# Patient Record
Sex: Female | Born: 1950
Health system: Southern US, Community
[De-identification: ages and names within clinical notes are randomized; demographics above are authoritative.]

## PROBLEM LIST (undated history)

## (undated) DIAGNOSIS — S12200A Unspecified displaced fracture of third cervical vertebra, initial encounter for closed fracture: Secondary | ICD-10-CM

## (undated) DIAGNOSIS — M1990 Unspecified osteoarthritis, unspecified site: Secondary | ICD-10-CM

## (undated) HISTORY — PX: DILATION AND CURETTAGE OF UTERUS: SHX78

## (undated) HISTORY — PX: OTHER SURGICAL HISTORY: SHX169

## (undated) HISTORY — PX: LIGATION / DIVISION SAPHENOUS VEIN: SUR828

---

## 2002-11-13 HISTORY — PX: BREAST BIOPSY: SHX20

## 2002-11-13 HISTORY — PX: BREAST SURGERY: SHX581

## 2006-04-03 ENCOUNTER — Ambulatory Visit: Payer: Self-pay

## 2009-06-28 ENCOUNTER — Other Ambulatory Visit: Payer: Self-pay | Admitting: Internal Medicine

## 2009-07-06 ENCOUNTER — Ambulatory Visit: Payer: Self-pay | Admitting: Internal Medicine

## 2009-10-15 ENCOUNTER — Ambulatory Visit: Payer: Self-pay | Admitting: Unknown Physician Specialty

## 2009-10-22 LAB — HM COLONOSCOPY: HM Colonoscopy: NORMAL

## 2009-11-13 HISTORY — PX: NOVASURE ABLATION: SHX5394

## 2010-03-18 ENCOUNTER — Ambulatory Visit: Payer: Self-pay | Admitting: Unknown Physician Specialty

## 2010-08-17 ENCOUNTER — Other Ambulatory Visit: Payer: Self-pay | Admitting: Internal Medicine

## 2010-09-06 ENCOUNTER — Ambulatory Visit: Payer: Self-pay | Admitting: Internal Medicine

## 2011-07-24 ENCOUNTER — Encounter: Payer: Self-pay | Admitting: Internal Medicine

## 2011-07-24 ENCOUNTER — Ambulatory Visit (INDEPENDENT_AMBULATORY_CARE_PROVIDER_SITE_OTHER): Payer: PRIVATE HEALTH INSURANCE | Admitting: Internal Medicine

## 2011-07-24 DIAGNOSIS — L639 Alopecia areata, unspecified: Secondary | ICD-10-CM

## 2011-07-24 DIAGNOSIS — Z0000 Encounter for general adult medical examination without abnormal findings: Secondary | ICD-10-CM

## 2011-07-24 NOTE — Progress Notes (Signed)
Subjective:    Patient ID: [name] [name], female    DOB: 01-13-1951, 60 y.o.   MRN: [id number]  HPI [name] [name] is a 60 year old female who presents for her annual exam. She denies any complaints today. She does note a recent history of alopecia areata. She reports that she initially developed a small circular area of hair loss on her mid upper scalp, and this gradually became larger. She was ultimately seen by dermatology and had Kenalog injection performed. She has followup again with dermatology later this month. Otherwise, she is doing well. She denies any change in appetite. She continues to run on almost a daily basis. She has not had any menses since her NovaSure procedure in 2011.   Outpatient Encounter Prescriptions as of 07/24/2011  Medication Sig Dispense Refill  . clobetasol (TEMOVATE) 0.05 % ointment Apply 1 application topically 2 (two) times daily.        Marland Kitchen estradiol (ESTRACE) 1 MG tablet Take 1 mg by mouth every other day.        Marland Kitchen PROGESTERONE MICRONIZED PO Take 2.5 mg by mouth every other day.          Review of Systems  Constitutional: Negative for fever, chills, appetite change, fatigue and unexpected weight change.  HENT: Negative for ear pain, congestion, sore throat, trouble swallowing, neck pain, voice change and sinus pressure.   Eyes: Negative for visual disturbance.  Respiratory: Negative for cough, shortness of breath, wheezing and stridor.   Cardiovascular: Negative for chest pain, palpitations and leg swelling.  Gastrointestinal: Negative for nausea, vomiting, abdominal pain, diarrhea, constipation, blood in stool, abdominal distention and anal bleeding.  Genitourinary: Negative for dysuria and flank pain.  Musculoskeletal: Negative for myalgias, arthralgias and gait problem.  Skin: Positive for rash (hair loss over scalp). Negative for color change.  Neurological: Negative for dizziness and headaches.  Hematological: Negative for adenopathy. Does not  bruise/bleed easily.  Psychiatric/Behavioral: Negative for suicidal ideas, sleep disturbance and dysphoric mood. The patient is not nervous/anxious.    BP 167/84  Pulse 54  Temp(Src) 97.9 F (36.6 C) (Oral)  Resp 16  Ht 5\' 4"  (1.626 m)  Wt 136 lb 8 oz (61.916 kg)  BMI 23.43 kg/m2  SpO2 100%     Objective:   Physical Exam  Constitutional: She is oriented to person, place, and time. She appears well-developed and well-nourished. No distress.  HENT:  Head: Normocephalic and atraumatic.  Right Ear: External ear normal.  Left Ear: External ear normal.  Nose: Nose normal.  Mouth/Throat: Oropharynx is clear and moist. No oropharyngeal exudate.  Eyes: Conjunctivae are normal. Pupils are equal, round, and reactive to light. Right eye exhibits no discharge. Left eye exhibits no discharge. No scleral icterus.  Neck: Normal range of motion. Neck supple. Carotid bruit is not present. No tracheal deviation present. No thyromegaly present.  Cardiovascular: Normal rate, regular rhythm, normal heart sounds and intact distal pulses.  Exam reveals no gallop and no friction rub.   No murmur heard. Pulmonary/Chest: Effort normal and breath sounds normal. No respiratory distress. She has no wheezes. She has no rales. She exhibits no tenderness. Right breast exhibits no inverted nipple, no mass, no nipple discharge, no skin change and no tenderness. Left breast exhibits no inverted nipple, no mass, no nipple discharge, no skin change and no tenderness. Breasts are symmetrical.  Musculoskeletal: Normal range of motion. She exhibits no edema and no tenderness.  Lymphadenopathy:    She has no cervical adenopathy.  Neurological:  She is alert and oriented to person, place, and time. No cranial nerve deficit. She exhibits normal muscle tone. Coordination normal.  Skin: Skin is warm and dry. No rash noted. She is not diaphoretic. No erythema. No pallor.     Psychiatric: She has a normal mood and affect. Her  behavior is normal. Judgment and thought content normal.          Assessment & Plan:  1. Annual Exam - patient presents for annual exam. Will check CBC, CMP, lipid profile, TSH with labs today. She had a recent Pap smear which was normal and we will defer Pap smear today. Her next Pap smear will be due in 2013. We will request immunization record from the hospital. Mammogram ordered today. Encouraged her to continue with healthy diet and regular exercise including running.  2. Alopecia Areata - patient with a recent history of alopecia areata. Will check TSH with labs today. She has no other symptoms to suggest thyroid dysfunction such as fatigue, constipation, temperature intolerance, or weight gain. We will also request records from her dermatologist.

## 2011-07-24 NOTE — Patient Instructions (Signed)
Labs today. Mammogram to be scheduled. Return to clinic in 1 year.

## 2011-07-25 LAB — CBC WITH DIFFERENTIAL/PLATELET
Basophils Absolute: 0 10*3/uL (ref 0.0–0.1)
Basophils Relative: 0.5 % (ref 0.0–3.0)
Eosinophils Absolute: 0.2 10*3/uL (ref 0.0–0.7)
HCT: 36.2 % (ref 36.0–46.0)
Monocytes Absolute: 0.2 10*3/uL (ref 0.1–1.0)
Neutro Abs: 2.7 10*3/uL (ref 1.4–7.7)
Neutrophils Relative %: 59.5 % (ref 43.0–77.0)
Platelets: 219 10*3/uL (ref 150.0–400.0)
WBC: 4.5 10*3/uL (ref 4.5–10.5)

## 2011-07-26 ENCOUNTER — Encounter: Payer: Self-pay | Admitting: Internal Medicine

## 2011-07-26 LAB — LIPID PANEL
HDL: 80.5 mg/dL (ref >39.00)
LDL Cholesterol: 90 mg/dL (ref 0–99)
Total CHOL/HDL Ratio: 2

## 2011-07-26 LAB — COMPREHENSIVE METABOLIC PANEL
ALT: 58 U/L — ABNORMAL HIGH (ref 0–35)
AST: 60 U/L — ABNORMAL HIGH (ref 0–37)
Alkaline Phosphatase: 71 U/L (ref 39–117)
BUN: 16 mg/dL (ref 6–23)
Calcium: 9.3 mg/dL (ref 8.4–10.5)
Creatinine, Ser: 0.9 mg/dL (ref 0.4–1.2)
Total Bilirubin: 0.5 mg/dL (ref 0.3–1.2)

## 2011-07-27 ENCOUNTER — Telehealth: Payer: Self-pay | Admitting: *Deleted

## 2011-07-27 NOTE — Telephone Encounter (Signed)
Her labs are okay except that liver function tests were slightly elevated. We can just repeat LFTs in 1 month.

## 2011-07-27 NOTE — Telephone Encounter (Signed)
I have not seen them

## 2011-07-27 NOTE — Telephone Encounter (Signed)
Patient is asking if you have been able to review her labs. When I looked it up, I couldn't tell if it was every routed to you. Have you seen them?

## 2011-07-27 NOTE — Telephone Encounter (Signed)
Left message asking patient to return my call.

## 2011-07-28 ENCOUNTER — Telehealth: Payer: Self-pay | Admitting: Internal Medicine

## 2011-07-28 NOTE — Telephone Encounter (Signed)
Patient called and stated since her LFTs are elevated she forgot to tell you that her son was diagnosed 2 years ago with Gill-Barson syndrome.  She stated she knows that your LFTs are elevated with this and it's heredity and do you think she should be tested.

## 2011-07-31 NOTE — Telephone Encounter (Signed)
No, then she doesn't need any other testing.  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

## 2011-07-31 NOTE — Telephone Encounter (Signed)
Notified patient and lab appt scheduled.

## 2011-08-01 ENCOUNTER — Telehealth: Payer: Self-pay | Admitting: Internal Medicine

## 2011-08-07 NOTE — Telephone Encounter (Signed)
Patient notified

## 2011-08-10 NOTE — Telephone Encounter (Signed)
Left message for patient to call back  

## 2011-08-28 ENCOUNTER — Other Ambulatory Visit (INDEPENDENT_AMBULATORY_CARE_PROVIDER_SITE_OTHER): Payer: PRIVATE HEALTH INSURANCE | Admitting: *Deleted

## 2011-08-28 DIAGNOSIS — L659 Nonscarring hair loss, unspecified: Secondary | ICD-10-CM

## 2011-08-29 LAB — HEPATIC FUNCTION PANEL
ALT: 30 U/L (ref 0–35)
AST: 32 U/L (ref 0–37)
Albumin: 4.2 g/dL (ref 3.5–5.2)

## 2011-09-26 ENCOUNTER — Ambulatory Visit: Payer: Self-pay | Admitting: Internal Medicine

## 2011-10-11 ENCOUNTER — Encounter: Payer: Self-pay | Admitting: Internal Medicine

## 2011-11-30 ENCOUNTER — Other Ambulatory Visit: Payer: Self-pay | Admitting: *Deleted

## 2011-11-30 MED ORDER — ESTRADIOL 1 MG PO TABS: 1.0000 mg | ORAL_TABLET | Freq: Every day | ORAL | Status: DC

## 2011-11-30 MED ORDER — MEDROXYPROGESTERONE ACETATE 2.5 MG PO TABS: 2.5000 mg | ORAL_TABLET | Freq: Every day | ORAL | Status: DC

## 2011-12-20 ENCOUNTER — Encounter: Payer: Self-pay | Admitting: Internal Medicine

## 2012-07-21 LAB — HM PAP SMEAR: HM PAP: NEGATIVE

## 2012-07-25 ENCOUNTER — Other Ambulatory Visit: Payer: PRIVATE HEALTH INSURANCE

## 2012-07-29 ENCOUNTER — Ambulatory Visit (INDEPENDENT_AMBULATORY_CARE_PROVIDER_SITE_OTHER): Payer: PRIVATE HEALTH INSURANCE | Admitting: Internal Medicine

## 2012-07-29 ENCOUNTER — Encounter: Payer: Self-pay | Admitting: Internal Medicine

## 2012-07-29 VITALS — BP 126/72 | HR 56 | Temp 98.5°F | Ht 64.0 in | Wt 138.2 lb

## 2012-07-29 DIAGNOSIS — N959 Unspecified menopausal and perimenopausal disorder: Secondary | ICD-10-CM

## 2012-07-29 DIAGNOSIS — Z1239 Encounter for other screening for malignant neoplasm of breast: Secondary | ICD-10-CM

## 2012-07-29 DIAGNOSIS — Z0000 Encounter for general adult medical examination without abnormal findings: Secondary | ICD-10-CM | POA: Insufficient documentation

## 2012-07-29 MED ORDER — MEDROXYPROGESTERONE ACETATE 2.5 MG PO TABS: 2.5000 mg | ORAL_TABLET | Freq: Every day | ORAL | Status: AC

## 2012-07-29 MED ORDER — ESTRADIOL 1 MG PO TABS: 1.0000 mg | ORAL_TABLET | Freq: Every day | ORAL | Status: DC

## 2012-07-29 NOTE — Assessment & Plan Note (Signed)
General medical exam is normal today. Pap is pending. Mammogram is ordered. Colonoscopy is up-to-date. Will check basic labs including CBC, CMP, lipid profile. Immunizations are up-to-date. Will obtain records on vaccinations from [facility] employee health.

## 2012-07-29 NOTE — Progress Notes (Signed)
Subjective:    Patient ID: [name] [name], female    DOB: 1951-05-12, 61 y.o.   MRN: [id number]  HPI 61 year old female presents for annual exam. She reports she is feeling well. She continues to be active, working full-time as a Engineer, civil (consulting) in the [employer]. She follows a healthy diet. She runs for exercise several days per week. She denies any new complaints or concerns today.  Outpatient Encounter Prescriptions as of 07/29/2012  Medication Sig Dispense Refill  . estradiol (ESTRACE) 1 MG tablet Take 1 tablet (1 mg total) by mouth daily.  90 tablet  1  . clobetasol (TEMOVATE) 0.05 % ointment Apply 1 application topically 2 (two) times daily.        . medroxyPROGESTERone (PROVERA) 2.5 MG tablet Take 1 tablet (2.5 mg total) by mouth daily.  90 tablet  1    BP 126/72  Pulse 56  Temp 98.5 F (36.9 C) (Oral)  Ht 5\' 4"  (1.626 m)  Wt 138 lb 4 oz (62.71 kg)  BMI 23.73 kg/m2  SpO2 97%  Review of Systems  Constitutional: Negative for fever, chills, appetite change, fatigue and unexpected weight change.  HENT: Negative for ear pain, congestion, sore throat, trouble swallowing, neck pain, voice change and sinus pressure.   Eyes: Negative for visual disturbance.  Respiratory: Negative for cough, shortness of breath, wheezing and stridor.   Cardiovascular: Negative for chest pain, palpitations and leg swelling.  Gastrointestinal: Negative for nausea, vomiting, abdominal pain, diarrhea, constipation, blood in stool, abdominal distention and anal bleeding.  Genitourinary: Negative for dysuria and flank pain.  Musculoskeletal: Negative for myalgias, arthralgias and gait problem.  Skin: Negative for color change and rash.  Neurological: Negative for dizziness and headaches.  Hematological: Negative for adenopathy. Does not bruise/bleed easily.  Psychiatric/Behavioral: Negative for suicidal ideas, disturbed wake/sleep cycle and dysphoric mood. The patient is not nervous/anxious.          Objective:   Physical Exam  Constitutional: She is oriented to person, place, and time. She appears well-developed and well-nourished. No distress.  HENT:  Head: Normocephalic and atraumatic.  Right Ear: External ear normal.  Left Ear: External ear normal.  Nose: Nose normal.  Mouth/Throat: Oropharynx is clear and moist. No oropharyngeal exudate.  Eyes: Conjunctivae normal are normal. Pupils are equal, round, and reactive to light. Right eye exhibits no discharge. Left eye exhibits no discharge. No scleral icterus.  Neck: Normal range of motion. Neck supple. No tracheal deviation present. No thyromegaly present.  Cardiovascular: Normal rate, regular rhythm, normal heart sounds and intact distal pulses.  Exam reveals no gallop and no friction rub.   No murmur heard. Pulmonary/Chest: Effort normal and breath sounds normal. No respiratory distress. She has no wheezes. She has no rales. She exhibits no tenderness.  Abdominal: Soft. Bowel sounds are normal. She exhibits no distension and no mass. There is no tenderness. There is no rebound and no guarding.  Genitourinary: Rectum normal, vagina normal and uterus normal. No breast swelling, tenderness, discharge or bleeding. Pelvic exam was performed with patient supine. There is no rash, tenderness or lesion on the right labia. There is no rash, tenderness or lesion on the left labia. Uterus is not enlarged and not tender. Cervix exhibits no motion tenderness, no discharge and no friability. Right adnexum displays no mass, no tenderness and no fullness. Left adnexum displays no mass, no tenderness and no fullness. No erythema or tenderness around the vagina. No vaginal discharge found.  Musculoskeletal: Normal range of motion.  She exhibits no edema and no tenderness.  Lymphadenopathy:    She has no cervical adenopathy.  Neurological: She is alert and oriented to person, place, and time. No cranial nerve deficit. She exhibits normal muscle tone.  Coordination normal.  Skin: Skin is warm and dry. No rash noted. She is not diaphoretic. No erythema. No pallor.  Psychiatric: She has a normal mood and affect. Her behavior is normal. Judgment and thought content normal.          Assessment & Plan:

## 2012-07-30 LAB — LIPID PANEL
Cholesterol: 194 mg/dL (ref 0–200)
HDL: 66 mg/dL (ref >39.00)
LDL Cholesterol: 117 mg/dL — ABNORMAL HIGH (ref 0–99)
VLDL: 10.8 mg/dL (ref 0.0–40.0)

## 2012-07-30 LAB — CBC WITH DIFFERENTIAL/PLATELET
Basophils Absolute: 0 10*3/uL (ref 0.0–0.1)
Basophils Relative: 0.5 % (ref 0.0–3.0)
Eosinophils Absolute: 0.1 10*3/uL (ref 0.0–0.7)
MCHC: 32.2 g/dL (ref 30.0–36.0)
MCV: 88.3 fl (ref 78.0–100.0)
Monocytes Absolute: 0.2 10*3/uL (ref 0.1–1.0)
Neutro Abs: 1.7 10*3/uL (ref 1.4–7.7)
Neutrophils Relative %: 51.7 % (ref 43.0–77.0)
RBC: 4.46 Mil/uL (ref 3.87–5.11)
RDW: 14 % (ref 11.5–14.6)

## 2012-07-30 LAB — COMPREHENSIVE METABOLIC PANEL
Alkaline Phosphatase: 59 U/L (ref 39–117)
Creatinine, Ser: 0.9 mg/dL (ref 0.4–1.2)
Glucose, Bld: 86 mg/dL (ref 70–99)
Sodium: 133 mEq/L — ABNORMAL LOW (ref 135–145)
Total Bilirubin: 0.9 mg/dL (ref 0.3–1.2)
Total Protein: 7.2 g/dL (ref 6.0–8.3)

## 2012-07-30 LAB — TSH: TSH: 2.2 u[IU]/mL (ref 0.35–5.50)

## 2012-07-31 ENCOUNTER — Other Ambulatory Visit (HOSPITAL_COMMUNITY)
Admission: RE | Admit: 2012-07-31 | Discharge: 2012-07-31 | Disposition: A | Discharge status: Home / Self Care | Payer: PRIVATE HEALTH INSURANCE | Source: Ambulatory Visit | Attending: Internal Medicine | Admitting: Internal Medicine

## 2012-07-31 DIAGNOSIS — Z1151 Encounter for screening for human papillomavirus (HPV): Secondary | ICD-10-CM | POA: Insufficient documentation

## 2012-07-31 DIAGNOSIS — Z01419 Encounter for gynecological examination (general) (routine) without abnormal findings: Secondary | ICD-10-CM | POA: Insufficient documentation

## 2012-07-31 NOTE — Addendum Note (Signed)
Addended by: Gilmer Mor on: 07/31/2012 11:10 AM   Modules accepted: Orders

## 2012-08-22 ENCOUNTER — Telehealth: Payer: Self-pay

## 2012-08-22 ENCOUNTER — Other Ambulatory Visit: Payer: Self-pay

## 2012-08-22 DIAGNOSIS — Z0000 Encounter for general adult medical examination without abnormal findings: Secondary | ICD-10-CM

## 2012-08-23 ENCOUNTER — Other Ambulatory Visit (INDEPENDENT_AMBULATORY_CARE_PROVIDER_SITE_OTHER): Payer: PRIVATE HEALTH INSURANCE

## 2012-08-23 DIAGNOSIS — Z0000 Encounter for general adult medical examination without abnormal findings: Secondary | ICD-10-CM

## 2012-08-23 LAB — CBC WITH DIFFERENTIAL/PLATELET
Basophils Absolute: 0 K/uL (ref 0.0–0.1)
Basophils Relative: 0.5 % (ref 0.0–3.0)
Eosinophils Absolute: 0.2 K/uL (ref 0.0–0.7)
Eosinophils Relative: 6.7 % — ABNORMAL HIGH (ref 0.0–5.0)
HCT: 39.2 % (ref 36.0–46.0)
Hemoglobin: 12.8 g/dL (ref 12.0–15.0)
Lymphocytes Relative: 32.4 % (ref 12.0–46.0)
Lymphs Abs: 0.9 K/uL (ref 0.7–4.0)
MCHC: 32.7 g/dL (ref 30.0–36.0)
MCV: 88.5 fl (ref 78.0–100.0)
Monocytes Absolute: 0.2 K/uL (ref 0.1–1.0)
Monocytes Relative: 7.5 % (ref 3.0–12.0)
Neutro Abs: 1.5 K/uL (ref 1.4–7.7)
Neutrophils Relative %: 52.9 % (ref 43.0–77.0)
Platelets: 186 K/uL (ref 150.0–400.0)
RBC: 4.43 Mil/uL (ref 3.87–5.11)
RDW: 14.3 % (ref 11.5–14.6)
WBC: 2.8 K/uL — ABNORMAL LOW (ref 4.5–10.5)

## 2012-08-31 ENCOUNTER — Encounter: Payer: Self-pay | Admitting: Internal Medicine

## 2012-08-31 DIAGNOSIS — D72819 Decreased white blood cell count, unspecified: Secondary | ICD-10-CM

## 2012-09-03 NOTE — Telephone Encounter (Signed)
Carollee Herter - Can you work on this referral asap? Thanks

## 2012-09-10 ENCOUNTER — Ambulatory Visit: Payer: Self-pay | Admitting: Oncology

## 2012-09-10 LAB — CBC CANCER CENTER
Basophil %: 0.6 %
Eosinophil #: 0.1 x10 3/mm (ref 0.0–0.7)
HCT: 42.4 % (ref 35.0–47.0)
HGB: 13.6 g/dL (ref 12.0–16.0)
Lymphocyte #: 1.2 x10 3/mm (ref 1.0–3.6)
Lymphocyte %: 32.9 %
MCH: 29.1 pg (ref 26.0–34.0)
MCHC: 32.2 g/dL (ref 32.0–36.0)
Monocyte %: 5.9 %
Neutrophil %: 56.6 %
Platelet: 201 x10 3/mm (ref 150–440)
RDW: 14 % (ref 11.5–14.5)
WBC: 3.8 x10 3/mm (ref 3.6–11.0)

## 2012-09-10 LAB — T4, FREE: Free Thyroxine: 1.16 ng/dL (ref 0.76–1.46)

## 2012-09-10 LAB — FERRITIN: Ferritin (ARMC): 30 ng/mL (ref 8–388)

## 2012-09-10 LAB — FOLATE: Folic Acid: 37.5 ng/mL (ref 3.1–100.0)

## 2012-09-11 LAB — PROT IMMUNOELECTROPHORES(ARMC)

## 2012-09-13 ENCOUNTER — Ambulatory Visit: Payer: Self-pay | Admitting: Oncology

## 2012-11-07 ENCOUNTER — Telehealth: Payer: Self-pay | Admitting: Internal Medicine

## 2012-11-07 NOTE — Telephone Encounter (Signed)
Pt is calling to schedule her Mammo

## 2012-11-13 ENCOUNTER — Ambulatory Visit: Payer: Self-pay | Admitting: Oncology

## 2012-11-21 ENCOUNTER — Ambulatory Visit: Payer: Self-pay | Admitting: Internal Medicine

## 2012-12-04 ENCOUNTER — Encounter: Payer: Self-pay | Admitting: Internal Medicine

## 2012-12-13 LAB — CBC CANCER CENTER
Basophil %: 0.8 %
Eosinophil #: 0.2 x10 3/mm (ref 0.0–0.7)
HCT: 42.3 % (ref 35.0–47.0)
HGB: 14.2 g/dL (ref 12.0–16.0)
MCV: 88 fL (ref 80–100)
Platelet: 208 x10 3/mm (ref 150–440)
RBC: 4.79 10*6/uL (ref 3.80–5.20)
WBC: 4.7 x10 3/mm (ref 3.6–11.0)

## 2012-12-14 ENCOUNTER — Ambulatory Visit: Payer: Self-pay | Admitting: Oncology

## 2012-12-28 ENCOUNTER — Other Ambulatory Visit: Payer: Self-pay

## 2012-12-30 ENCOUNTER — Encounter: Payer: Self-pay | Admitting: Internal Medicine

## 2013-06-30 ENCOUNTER — Encounter: Payer: Self-pay | Admitting: Internal Medicine

## 2013-08-04 ENCOUNTER — Encounter: Payer: PRIVATE HEALTH INSURANCE | Admitting: Internal Medicine

## 2013-08-12 ENCOUNTER — Encounter: Payer: Self-pay | Admitting: Internal Medicine

## 2013-08-12 ENCOUNTER — Ambulatory Visit (INDEPENDENT_AMBULATORY_CARE_PROVIDER_SITE_OTHER): Payer: 59 | Admitting: Internal Medicine

## 2013-08-12 VITALS — BP 120/80 | HR 56 | Temp 98.2°F | Ht 62.75 in | Wt 138.0 lb

## 2013-08-12 DIAGNOSIS — Z0000 Encounter for general adult medical examination without abnormal findings: Secondary | ICD-10-CM | POA: Insufficient documentation

## 2013-08-12 DIAGNOSIS — Z0001 Encounter for general adult medical examination with abnormal findings: Secondary | ICD-10-CM | POA: Insufficient documentation

## 2013-08-12 NOTE — Progress Notes (Signed)
Subjective:    Patient ID: [name] [name], female    DOB: June 22, 1951, 62 y.o.   MRN: [id number]  HPI 62 year old female presents for annual exam. She reports she is feeling well. 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 She is physically active, running on almost daily basis. She tries to follow a healthy diet. She is up-to-date on mammogram and colonoscopy.  Outpatient Encounter Prescriptions as of 08/12/2013  Medication Sig Dispense Refill  . estradiol (ESTRACE) 1 MG tablet Take 1 tablet (1 mg total) by mouth daily.  90 tablet  1  . clobetasol (TEMOVATE) 0.05 % ointment Apply 1 application topically 2 (two) times daily.         No facility-administered encounter medications on file as of 08/12/2013.   BP 120/80  Pulse 56  Temp(Src) 98.2 F (36.8 C) (Oral)  Ht 5' 2.75" (1.594 m)  Wt 138 lb (62.596 kg)  BMI 24.64 kg/m2  SpO2 98%  Review of Systems  Constitutional: Negative for fever, chills, appetite change, fatigue and unexpected weight change.  HENT: Negative for ear pain, congestion, sore throat, trouble swallowing, neck pain, voice change and sinus pressure.   Eyes: Negative for visual disturbance.  Respiratory: Negative for cough, shortness of breath, wheezing and stridor.   Cardiovascular: Negative for chest pain, palpitations and leg swelling.  Gastrointestinal: Negative for nausea, vomiting, abdominal pain, diarrhea, constipation, blood in stool, abdominal distention and anal bleeding.  Genitourinary: Negative for dysuria and flank pain.  Musculoskeletal: Negative for myalgias, arthralgias and gait problem.  Skin: Negative for color change and rash.  Neurological: Negative for dizziness and headaches.  Hematological: Negative for adenopathy. Does not bruise/bleed easily.  Psychiatric/Behavioral: Negative for suicidal ideas, sleep disturbance and dysphoric mood. The patient is not nervous/anxious.        Objective:   Physical Exam  Constitutional: She is  oriented to person, place, and time. She appears well-developed and well-nourished. No distress.  HENT:  Head: Normocephalic and atraumatic.  Right Ear: External ear normal.  Left Ear: External ear normal.  Nose: Nose normal.  Mouth/Throat: Oropharynx is clear and moist. No oropharyngeal exudate.  Eyes: Conjunctivae are normal. Pupils are equal, round, and reactive to light. Right eye exhibits no discharge. Left eye exhibits no discharge. No scleral icterus.  Neck: Normal range of motion. Neck supple. No tracheal deviation present. No thyromegaly present.  Cardiovascular: Normal rate, regular rhythm, normal heart sounds and intact distal pulses.  Exam reveals no gallop and no friction rub.   No murmur heard. Pulmonary/Chest: Effort normal and breath sounds normal. No accessory muscle usage. Not tachypneic. No respiratory distress. She has no decreased breath sounds. She has no wheezes. She has no rales. She exhibits no tenderness. Right breast exhibits no inverted nipple, no mass, no nipple discharge, no skin change and no tenderness. Left breast exhibits no inverted nipple, no mass, no nipple discharge, no skin change and no tenderness. Breasts are symmetrical.  Abdominal: Soft. Bowel sounds are normal. She exhibits no distension and no mass. There is no tenderness. There is no rebound and no guarding.  Musculoskeletal: Normal range of motion. She exhibits no edema and no tenderness.  Lymphadenopathy:    She has no cervical adenopathy.  Neurological: She is alert and oriented to person, place, and time. No cranial nerve deficit. She exhibits normal muscle tone. Coordination normal.  Skin: Skin is warm and dry. No rash noted. She is not diaphoretic. No erythema. No pallor.  Psychiatric: She has  a normal mood and affect. Her behavior is normal. Judgment and thought content normal.          Assessment & Plan:

## 2013-08-12 NOTE — Assessment & Plan Note (Signed)
General medical exam including breast exam normal today. Pap and pelvic exam deferred as Pap normal in 2013, HPV negative. Mammogram is up-to-date. Patient will receive flu vaccine at work. Will check fasting labs including CBC, CMP, lipid profile. Encouraged continued efforts at healthy diet and regular physical activity. Followup in one year or sooner as needed.

## 2013-08-13 ENCOUNTER — Other Ambulatory Visit (INDEPENDENT_AMBULATORY_CARE_PROVIDER_SITE_OTHER): Payer: 59

## 2013-08-13 DIAGNOSIS — Z0000 Encounter for general adult medical examination without abnormal findings: Secondary | ICD-10-CM

## 2013-08-14 LAB — CBC WITH DIFFERENTIAL/PLATELET
Basophils Absolute: 0 10*3/uL (ref 0.0–0.1)
Eosinophils Absolute: 0.2 10*3/uL (ref 0.0–0.7)
Eosinophils Relative: 5.5 % — ABNORMAL HIGH (ref 0.0–5.0)
HCT: 38.1 % (ref 36.0–46.0)
Lymphs Abs: 1.6 10*3/uL (ref 0.7–4.0)
MCHC: 33 g/dL (ref 30.0–36.0)
MCV: 83.3 fl (ref 78.0–100.0)
Monocytes Absolute: 0.2 10*3/uL (ref 0.1–1.0)
Platelets: 222 10*3/uL (ref 150.0–400.0)
RDW: 15.3 % — ABNORMAL HIGH (ref 11.5–14.6)

## 2013-08-15 LAB — COMPREHENSIVE METABOLIC PANEL
ALT: 21 U/L (ref 0–35)
AST: 29 U/L (ref 0–37)
Alkaline Phosphatase: 65 U/L (ref 39–117)
Potassium: 4.6 mEq/L (ref 3.5–5.1)
Sodium: 138 mEq/L (ref 135–145)
Total Bilirubin: 0.5 mg/dL (ref 0.3–1.2)
Total Protein: 7.1 g/dL (ref 6.0–8.3)

## 2013-08-15 LAB — LIPID PANEL
Cholesterol: 205 mg/dL — ABNORMAL HIGH (ref 0–200)
Total CHOL/HDL Ratio: 3

## 2013-08-15 LAB — TSH: TSH: 2.4 u[IU]/mL (ref 0.35–5.50)

## 2013-08-15 LAB — LDL CHOLESTEROL, DIRECT: Direct LDL: 129 mg/dL

## 2013-09-18 ENCOUNTER — Other Ambulatory Visit: Payer: Self-pay

## 2014-02-12 ENCOUNTER — Encounter: Payer: Self-pay | Admitting: Internal Medicine

## 2014-02-18 ENCOUNTER — Ambulatory Visit: Payer: Self-pay | Admitting: Internal Medicine

## 2014-02-18 LAB — HM MAMMOGRAPHY: HM Mammogram: NORMAL

## 2014-03-25 ENCOUNTER — Encounter: Payer: Self-pay | Admitting: Internal Medicine

## 2014-06-10 ENCOUNTER — Other Ambulatory Visit: Payer: Self-pay | Admitting: Internal Medicine

## 2014-08-18 ENCOUNTER — Encounter: Payer: PRIVATE HEALTH INSURANCE | Admitting: Internal Medicine

## 2014-08-20 ENCOUNTER — Encounter: Payer: Self-pay | Admitting: Internal Medicine

## 2014-08-20 ENCOUNTER — Ambulatory Visit (INDEPENDENT_AMBULATORY_CARE_PROVIDER_SITE_OTHER): Payer: 59 | Admitting: Internal Medicine

## 2014-08-20 VITALS — BP 120/66 | HR 60 | Temp 98.2°F | Ht 62.5 in | Wt 139.0 lb

## 2014-08-20 DIAGNOSIS — Z0000 Encounter for general adult medical examination without abnormal findings: Secondary | ICD-10-CM

## 2014-08-20 DIAGNOSIS — L639 Alopecia areata, unspecified: Secondary | ICD-10-CM

## 2014-08-20 DIAGNOSIS — N959 Unspecified menopausal and perimenopausal disorder: Secondary | ICD-10-CM

## 2014-08-20 LAB — COMPREHENSIVE METABOLIC PANEL
ALT: 22 U/L (ref 0–35)
AST: 28 U/L (ref 0–37)
Albumin: 3.7 g/dL (ref 3.5–5.2)
Alkaline Phosphatase: 76 U/L (ref 39–117)
BUN: 13 mg/dL (ref 6–23)
CALCIUM: 9.7 mg/dL (ref 8.4–10.5)
CO2: 28 mEq/L (ref 19–32)
CREATININE: 0.8 mg/dL (ref 0.4–1.2)
Chloride: 101 mEq/L (ref 96–112)
GFR: 74.29 mL/min (ref >60.00)
Glucose, Bld: 78 mg/dL (ref 70–99)
Potassium: 4.3 mEq/L (ref 3.5–5.1)
Sodium: 140 mEq/L (ref 135–145)
Total Bilirubin: 0.7 mg/dL (ref 0.2–1.2)
Total Protein: 7.1 g/dL (ref 6.0–8.3)

## 2014-08-20 LAB — VITAMIN D 25 HYDROXY (VIT D DEFICIENCY, FRACTURES): VITD: 36.05 ng/mL (ref 30.00–100.00)

## 2014-08-20 LAB — CBC WITH DIFFERENTIAL/PLATELET
BASOS ABS: 0 10*3/uL (ref 0.0–0.1)
BASOS PCT: 0.5 % (ref 0.0–3.0)
Eosinophils Absolute: 0.3 10*3/uL (ref 0.0–0.7)
Eosinophils Relative: 8 % — ABNORMAL HIGH (ref 0.0–5.0)
HEMATOCRIT: 42.9 % (ref 36.0–46.0)
HEMOGLOBIN: 14 g/dL (ref 12.0–15.0)
Lymphocytes Relative: 29.8 % (ref 12.0–46.0)
Lymphs Abs: 1.3 10*3/uL (ref 0.7–4.0)
MCHC: 32.5 g/dL (ref 30.0–36.0)
MCV: 86.1 fl (ref 78.0–100.0)
MONO ABS: 0.3 10*3/uL (ref 0.1–1.0)
Monocytes Relative: 6.6 % (ref 3.0–12.0)
NEUTROS ABS: 2.4 10*3/uL (ref 1.4–7.7)
Neutrophils Relative %: 55.1 % (ref 43.0–77.0)
Platelets: 210 10*3/uL (ref 150.0–400.0)
RBC: 4.99 Mil/uL (ref 3.87–5.11)
RDW: 17.5 % — ABNORMAL HIGH (ref 11.5–15.5)
WBC: 4.3 10*3/uL (ref 4.0–10.5)

## 2014-08-20 LAB — LIPID PANEL
CHOL/HDL RATIO: 4
Cholesterol: 218 mg/dL — ABNORMAL HIGH (ref 0–200)
HDL: 60.5 mg/dL (ref >39.00)
LDL Cholesterol: 145 mg/dL — ABNORMAL HIGH (ref 0–99)
NONHDL: 157.5
TRIGLYCERIDES: 61 mg/dL (ref 0.0–149.0)
VLDL: 12.2 mg/dL (ref 0.0–40.0)

## 2014-08-20 NOTE — Progress Notes (Signed)
Subjective:    Patient ID: [name] [name], female    DOB: [birth year], 63 y.o.   MRN: [id number]  HPI 63YO female presents for physical exam.  Recently had recurrence of alopecia areata. Being treated by Dr. Kellie Moor.  Trying to taper herself off hormonal medications. However had nearly 20 hot flashes per day after stopping medications for brief period.  Aside from this, feeling well. Continues to run at home 3-4 times per week. Follows a healthy diet.  Review of Systems  Constitutional: Negative for fever, chills, appetite change, fatigue and unexpected weight change.  Eyes: Negative for visual disturbance.  Respiratory: Negative for shortness of breath.   Cardiovascular: Negative for chest pain and leg swelling.  Gastrointestinal: Negative for nausea, vomiting, abdominal pain, diarrhea and constipation.  Musculoskeletal: Negative for arthralgias.  Skin: Negative for color change and rash.  Hematological: Negative for adenopathy. Does not bruise/bleed easily.  Psychiatric/Behavioral: Negative for dysphoric mood. The patient is not nervous/anxious.        Objective:    BP 120/66  Pulse 60  Temp(Src) 98.2 F (36.8 C) (Oral)  Ht 5' 2.5" (1.588 m)  Wt 139 lb (63.05 kg)  BMI 25.00 kg/m2  SpO2 97% Physical Exam  Constitutional: She is oriented to person, place, and time. She appears well-developed and well-nourished. No distress.  HENT:  Head: Normocephalic and atraumatic.  Right Ear: External ear normal.  Left Ear: External ear normal.  Nose: Nose normal.  Mouth/Throat: Oropharynx is clear and moist. No oropharyngeal exudate.  Eyes: Conjunctivae are normal. Pupils are equal, round, and reactive to light. Right eye exhibits no discharge. Left eye exhibits no discharge. No scleral icterus.  Neck: Normal range of motion. Neck supple. No tracheal deviation present. No thyromegaly present.  Cardiovascular: Normal rate, regular rhythm, normal heart sounds and intact distal  pulses.  Exam reveals no gallop and no friction rub.   No murmur heard. Pulmonary/Chest: Effort normal and breath sounds normal. No accessory muscle usage. Not tachypneic. No respiratory distress. She has no decreased breath sounds. She has no wheezes. She has no rhonchi. She has no rales. She exhibits no tenderness. Right breast exhibits no inverted nipple, no mass, no nipple discharge, no skin change and no tenderness. Left breast exhibits no inverted nipple, no mass, no nipple discharge, no skin change and no tenderness. Breasts are symmetrical.  Abdominal: Soft. Bowel sounds are normal. She exhibits no distension and no mass. There is no tenderness. There is no rebound and no guarding.  Musculoskeletal: Normal range of motion. She exhibits no edema and no tenderness.  Lymphadenopathy:    She has no cervical adenopathy.  Neurological: She is alert and oriented to person, place, and time. No cranial nerve deficit. She exhibits normal muscle tone. Coordination normal.  Skin: Skin is warm and dry. No rash noted. She is not diaphoretic. No erythema. No pallor.  Psychiatric: She has a normal mood and affect. Her behavior is normal. Judgment and thought content normal.          Assessment & Plan:   Problem List Items Addressed This Visit     Unprioritized   Alopecia areata     Continue to follow with Dr. Kellie Moor.    Menopausal disorder     Discussed potential ways to taper off medications. Will continue medications for now given recent severe hot flashes.    Routine general medical examination at a health care facility - Primary     General medical exam including breast  exam normal today. Pap and pelvic exam deferred as Pap normal in 2013, HPV negative. Mammogram ordered. Flu vaccine UTD. Will check fasting labs including CBC, CMP, lipid profile. Encouraged continued efforts at healthy diet and regular physical activity. Followup in one year or sooner as needed.      Relevant Orders       CBC with Differential      Comprehensive metabolic panel      Lipid panel      Microalbumin / creatinine urine ratio      Vit D  25 hydroxy (rtn osteoporosis monitoring)       Return in about 1 year (around 08/21/2015) for Physical.

## 2014-08-20 NOTE — Progress Notes (Signed)
Pre visit review using our clinic review tool, if applicable. No additional management support is needed unless otherwise documented below in the visit note. 

## 2014-08-20 NOTE — Assessment & Plan Note (Signed)
Discussed potential ways to taper off medications. Will continue medications for now given recent severe hot flashes.

## 2014-08-20 NOTE — Patient Instructions (Signed)

## 2014-08-20 NOTE — Assessment & Plan Note (Signed)
Continue to follow with Dr. Kellie Moor.

## 2014-08-20 NOTE — Assessment & Plan Note (Signed)
General medical exam including breast exam normal today. Pap and pelvic exam deferred as Pap normal in 2013, HPV negative. Mammogram ordered. Flu vaccine UTD. Will check fasting labs including CBC, CMP, lipid profile. Encouraged continued efforts at healthy diet and regular physical activity. Followup in one year or sooner as needed.

## 2014-11-19 ENCOUNTER — Encounter: Payer: Self-pay | Admitting: *Deleted

## 2014-11-19 NOTE — Telephone Encounter (Signed)
From: Vinnie Level Sent: 08/24/2014 10:42 AM EDT To: Aviva Kluver Subject: RE:Flu Shot Clinic Saturday, 08/29/14 [place]  I had a flu shot at [facility] in Sept. It should have been added to my chart. It was discussed with the nurse on my last visit. Thanks, Maurita  ----- Message ----- From: Genia Plants Sent: 08/24/2014 8:44 AM EDT To: Vinnie Level Subject: Flu Shot Clinic Saturday, 08/29/14 [facility] will hold a [employer] on Saturday, October 17 from 9 am to noon. In order to plan appropriately, we ask you to please call the office to schedule an appointment time during the clinic. Our schedulers are ready to assist you, Monday through Friday, 8a to 5p at [phone]. Thank you for choosing [facility] for your healthcare needs.

## 2015-01-12 ENCOUNTER — Encounter: Payer: Self-pay | Admitting: Internal Medicine

## 2015-06-15 ENCOUNTER — Ambulatory Visit: Payer: 59 | Attending: Orthopedic Surgery

## 2015-06-15 DIAGNOSIS — M25552 Pain in left hip: Secondary | ICD-10-CM | POA: Insufficient documentation

## 2015-06-15 DIAGNOSIS — R29898 Other symptoms and signs involving the musculoskeletal system: Secondary | ICD-10-CM | POA: Insufficient documentation

## 2015-06-15 DIAGNOSIS — M25669 Stiffness of unspecified knee, not elsewhere classified: Secondary | ICD-10-CM

## 2015-06-16 NOTE — Therapy (Signed)
[place] MAIN [facility] SERVICES [address] [place], [place], [address] Phone: [phone]   Fax:  [phone]  Physical Therapy Evaluation  Patient Details  Name: [name] [name] MRN: [id number] Date of Birth: Mar 20, 1951 Referring Provider:  Hessie Knows, MD  Encounter Date: 06/15/2015      PT End of Session - 06/16/15 1534    Visit Number 1   Number of Visits 9   Date for PT Re-Evaluation 07/13/15   PT Start Time 35   PT Stop Time 1735   PT Time Calculation (min) 65 min   Activity Tolerance Patient tolerated treatment well   Behavior During Therapy [facility] for tasks assessed/performed      Past Medical History  Diagnosis Date  . Alopecia     Followed by Dr. Kellie Moor, s/p kenalog injection    Past Surgical History  Procedure Laterality Date  . Novasure ablation  2011    Dr. Rayford Halsted  . Ligation / division saphenous vein    . Breast surgery  2004    right breast biopsy, benign    There were no vitals filed for this visit.  Visit Diagnosis:  Left hip pain - Plan: PT plan of care cert/re-cert  Decreased ROM of lower extremity - Plan: PT plan of care cert/re-cert      Subjective Assessment - 06/15/15 1647    Subjective pt reports she has left hip pain and is a runner and used to run 25 miles a week.  pt relates she has stopped running due to increase in pain and limping. pt reports pain has been going on for a couple of moths, where it started suddenly one day in her hamstring and now occasionally has lateral and anterior hip.  the pain is not constant, and today has hamstring and anteiror hip pain.  pt relates hamstring feels like a msucle spasm and the anteior hip pain is sharp and notes hip feels unstable.  pt  denies any numbness and tingling or back pain.  pt relates medication makes it better and running and prolonged time standing/walking moving makes it worse.  She is an Financial planner and spends time sitting, but when on  her feet moving she has incresaed pain.  pt states she starts when shes does run she starts with a fast walk (warm up) and graduallyincresae speed to 6 miles/hour on a treadmill.  she also reports history of left hamstring strain over a year ago and  has to use UEs to lift left LE into her SUV  due to hamstring  and anteroir hip pain (deep) . pt denies popping and cliking in her hip.     Currently in Pain? Yes   Pain Score 4    Pain Location Leg  hamstring and anterior hip   Pain Orientation Anterior;Posterior   Pain Onset More than a month ago            [facility] PT Assessment - 06/16/15 0001    Assessment   Medical Diagnosis left hip osteoarthritis   Onset Date/Surgical Date 03/30/15   Hand Dominance Right   Prior Therapy none   Precautions   Precautions None   Restrictions   Weight Bearing Restrictions No   Balance Screen   Has the patient fallen in the past 6 months No   Has the patient had a decrease in activity level because of a fear of falling?  Yes   Is the patient reluctant to leave their home because  of a fear of falling?  No   Home Environment   Living Environment Unsure   Prior Function   Level of Independence Independent   Vocation Full time employment   Vocation Requirements walking and sitting   Cognition   Overall Cognitive Status Within Functional Limits for tasks assessed   Sensation   Light Touch Appears Intact   Coordination   Gross Motor Movements are Fluid and Coordinated Yes     Lumbar ROM: WNL Repeated lumbar extension x10: no change in symptoms  Leg length: negative  LE dermatomes: WNL  Left knee MMT: 5/5  Hip MMT: Bilateral hip flexion 4/5, pain with L LE Bilateral hip extension: 4-/5, anterior left hip pain with R MMT Bilateral hip abduction: 4/5, medial left thigh pain with left hip abduction MMT  Bilateral ankle dorsiflexion/eversion/inversion MMT: 5/5  Hip ROM: Right hip internal AROM: 30 degrees in sitting Left hip internal AROM:  20 degrees  with pain in sitting Right external AROM: 40 degrees in sitting Right external AROM: 30 degrees in sitting R hip abduction PROM: 45 degrees in supine L left hip abduction PROM: 40 degrees with empty end feel in supine  Left hip scour: positive  Hamstring length: Right 38 degrees supine with straight leg Left: 45 degrees in supine with straight leg  Ober Test: positive on left   Thomas Test: positive bilaterally   Pain with TFL (abduction left LE, internally rotate and flexion) on left LE  Palpation:  Lumbar joint play: no pain TFL: pain on left LE Left greater trochanter: painful    therex: Hip flexor stretch 2x30 sec Hip adductor stretch 2x30 sec Hamstring stretch 2x30 Pt required verbal and tactile cues for proper technique, hips level and not rotated                      PT Education - 06/16/15 1533    Education provided Yes   Education Details plan of care, instruction on HEP (stretching)   Person(s) Educated Patient   Methods Explanation;Demonstration   Comprehension Verbalized understanding;Returned demonstration             PT Long Term Goals - 06/16/15 1700    PT LONG TERM GOAL #1   Title pt will be able to run 5 miles with <3/10 pain and without limb in order to return to prior level of function   Baseline has stopped running due to pain and limb   Time 4   Period Weeks   Status New   PT LONG TERM GOAL #2   Title pt's LEFS score will improve by at least 10 points demonstrating improved functional use of L LE   Baseline 51/80   Time 4   Period Weeks   PT LONG TERM GOAL #3   Title pt's bilateral hamstring length will improve to least 60 degrees to improve mobility   Baseline R: 38 degrees Left 45 degrees   Period Weeks   Status New               Plan - 06/16/15 1535    Clinical Impression Statement pt is a 64 year old female with chronic ~ 2 month histroy of posterior hip/LE pain that now also presents in the  lateral and anterior hip.  pt presents with decreased left hip ROM, decreased hip strength, decreased hamstring /hip flexor length, pain in left TFL and in greater trochanter with palpation.  pt will benefit  from skilled PT servies  to improve deficits, decrease pain and return to prior level of function.    Pt will benefit from skilled therapeutic intervention in order to improve on the following deficits Decreased range of motion;Pain;Decreased mobility;Decreased strength   PT Frequency 2x / week   PT Duration 4 weeks   PT Treatment/Interventions Moist Heat;Cryotherapy;Electrical Stimulation;Iontophoresis 4mg /ml Dexamethasone;Aquatic Therapy;Ultrasound;Gait training;Functional mobility training;Therapeutic activities;Therapeutic exercise;Manual techniques;Neuromuscular re-education;Balance training;Passive range of motion;Dry needling;Taping         Problem List Patient Active Problem List   Diagnosis Date Noted  . Routine general medical examination at a health care facility 08/12/2013  . Menopausal disorder 07/29/2012  . Alopecia areata 07/24/2011   Renford Dills, SPT This entire session was performed under direct supervision and direction of a licensed therapist/therapist assistant . I have personally read, edited and approve of the note as written. Gorden Harms. Tortorici, PT, DPT [phone]  Tortorici,Ashley 06/16/2015, 6:05 PM  [place] MAIN [facility] SERVICES [address] [place], [place], [address] Phone: [phone]   Fax:  [phone]

## 2015-06-16 NOTE — Patient Instructions (Signed)
[email or web address]  Hip flexor stretch 3x30 sec Hamstring stretch 3x30 sec Adductor stretch 3x30 sec

## 2015-06-21 ENCOUNTER — Ambulatory Visit: Payer: 59

## 2015-06-21 DIAGNOSIS — M25669 Stiffness of unspecified knee, not elsewhere classified: Secondary | ICD-10-CM

## 2015-06-21 DIAGNOSIS — M25552 Pain in left hip: Secondary | ICD-10-CM

## 2015-06-22 NOTE — Therapy (Signed)
[place] MAIN [facility] SERVICES [address] [place], [place], [address] Phone: [phone]   Fax:  [phone]  Physical Therapy Treatment  Patient Details  Name: [name] [name] MRN: [id number] Date of Birth: 18-Jul-1951 Referring Provider:  Hessie Knows, MD  Encounter Date: 06/21/2015      PT End of Session - 06/22/15 1104    Visit Number 2   Number of Visits 9   Date for PT Re-Evaluation 07/13/15   PT Start Time 7867   PT Stop Time 1818   PT Time Calculation (min) 43 min   Activity Tolerance Patient tolerated treatment well   Behavior During Therapy [facility] for tasks assessed/performed      Past Medical History  Diagnosis Date  . Alopecia     Followed by Dr. Kellie Moor, s/p kenalog injection    Past Surgical History  Procedure Laterality Date  . Novasure ablation  2011    Dr. Rayford Halsted  . Ligation / division saphenous vein    . Breast surgery  2004    right breast biopsy, benign    There were no vitals filed for this visit.  Visit Diagnosis:  Left hip pain  Decreased ROM of lower extremity      Subjective Assessment - 06/22/15 1101    Subjective pt relates she is doing well this afternoon and currently has no pain.  pt relates she tried the stretches from her HEP but will need to reveiw them today for correct technique.  pt relates her pain has not changed since her last visit and that it is primairly in her left posterior hip/hamstring.    Currently in Pain? No/denies   Pain Score 0-No pain   Pain Onset More than a month ago      Slump test: + on left Palpation: pain on left ischial tuberosity, increased tension in biceps femoris versus semitendinosus and semimembranosus  Manual therapy: Soft tissue mobilization to left hamstring (efflurage, petrissage, muscle stripping) and with ISTM to left hamstring Pt's tension in biceps femoris decreased with manual therapy  MET to increase hamstring length x 2-3 minutes in  supine  Therex: Hamstring stretch Adductor stretch Hip flexor stretch x5 min total Pt required verbal cues for correct technique                           PT Education - 06/22/15 1103    Education provided Yes   Education Details reviewed HEP for correct technique    Person(s) Educated Patient   Methods Explanation;Demonstration   Comprehension Verbalized understanding;Returned demonstration             PT Long Term Goals - 06/16/15 1700    PT LONG TERM GOAL #1   Title pt will be able to run 5 miles with <3/10 pain and without limb in order to return to prior level of function   Baseline has stopped running due to pain and limb   Time 4   Period Weeks   Status New   PT LONG TERM GOAL #2   Title pt's LEFS score will improve by at least 10 points demonstrating improved functional use of L LE   Baseline 51/80   Time 4   Period Weeks   PT LONG TERM GOAL #3   Title pt's bilateral hamstring length will improve to least 60 degrees to improve mobility   Baseline R: 38 degrees Left 45 degrees   Period Weeks  Status New               Plan - 06/22/15 1104    Clinical Impression Statement session consisted of manual therapy to left hamstring after palpation to ischial tuberosity and inferior reproduced patient's posterior hip pain.  pt reported decreased pain with hip flexion after treatment.  pt has some altered neurodynamics/neural tissue sensitive due to groin symptorms reproduced with slump test on left LE.     Pt will benefit from skilled therapeutic intervention in order to improve on the following deficits Decreased range of motion;Pain;Decreased mobility;Decreased strength;Hypomobility;Impaired flexibility   Rehab Potential Good   PT Frequency 2x / week   PT Duration 4 weeks   PT Treatment/Interventions Moist Heat;Cryotherapy;Electrical Stimulation;Iontophoresis 21m/ml Dexamethasone;Aquatic Therapy;Ultrasound;Gait training;Functional  mobility training;Therapeutic activities;Therapeutic exercise;Manual techniques;Neuromuscular re-education;Balance training;Passive range of motion;Dry needling;Taping   PT Next Visit Plan manual therapy, neural "flossing"         Problem List Patient Active Problem List   Diagnosis Date Noted  . Routine general medical examination at a health care facility 08/12/2013  . Menopausal disorder 07/29/2012  . Alopecia areata 07/24/2011   JAlwyn RenThis entire session was performed under direct supervision and direction of a licensed tChiropractor. I have personally read, edited and approve of the note as written. AGorden Harms Tortorici, PT, DPT #[phone] Tortorici,Ashley 06/22/2015, 2:20 PM  [place] [facility] [address] [address]Phone: [phone]  Fax:  [phone]

## 2015-06-23 ENCOUNTER — Ambulatory Visit: Payer: 59

## 2015-06-23 DIAGNOSIS — M25669 Stiffness of unspecified knee, not elsewhere classified: Secondary | ICD-10-CM

## 2015-06-23 DIAGNOSIS — M25552 Pain in left hip: Secondary | ICD-10-CM | POA: Diagnosis not present

## 2015-06-24 NOTE — Therapy (Signed)
[place] MAIN [facility] SERVICES [address] [place], [place], [address] Phone: [phone]   Fax:  [phone]  Physical Therapy Treatment  Patient Details  Name: [name] [name] MRN: [id number] Date of Birth: Dec 06, 1950 Referring Provider:  Hessie Knows, MD  Encounter Date: 06/23/2015      PT End of Session - 06/24/15 1056    Visit Number 3   Number of Visits 9   Date for PT Re-Evaluation 07/13/15   PT Start Time 1650   PT Stop Time 1730   PT Time Calculation (min) 40 min   Activity Tolerance Patient tolerated treatment well   Behavior During Therapy [facility] for tasks assessed/performed      Past Medical History  Diagnosis Date  . Alopecia     Followed by Dr. Kellie Moor, s/p kenalog injection    Past Surgical History  Procedure Laterality Date  . Novasure ablation  2011    Dr. Rayford Halsted  . Ligation / division saphenous vein    . Breast surgery  2004    right breast biopsy, benign    There were no vitals filed for this visit.  Visit Diagnosis:  Left hip pain  Decreased ROM of lower extremity      Subjective Assessment - 06/23/15 1915    Subjective pt reports the treatment from last session seemed to have helped and she is a little sore but is not sure if it is from manual therapy or from being on her feet all day.     Currently in Pain? No/denies  sorness   Pain Score 0-No pain   Pain Onset More than a month ago      Manual therapy: Soft tissue mobilization to left hamstring (efflurage, petrissage, muscle stripping) and with ISTM to left hamstring Pt's tension and soreness in biceps femoris and semitendinosus  decreased with manual therapy  MET to increase hamstring length x 2-3 minutes in supine  Therex: Left hamstring stretch 2x30 sec Neural mobilization (flossing) of left LE 2x10 due to positive slump test on previous visit  Pt required verbal cueing for proper technique                                PT Education - 06/24/15 1054    Education provided Yes   Education Details nerve mobilizatoin/nerve flossing to alleviate neural tension in left LE   Person(s) Educated Patient   Methods Explanation;Demonstration   Comprehension Verbalized understanding;Returned demonstration             PT Long Term Goals - 06/16/15 1700    PT LONG TERM GOAL #1   Title pt will be able to run 5 miles with <3/10 pain and without limb in order to return to prior level of function   Baseline has stopped running due to pain and limb   Time 4   Period Weeks   Status New   PT LONG TERM GOAL #2   Title pt's LEFS score will improve by at least 10 points demonstrating improved functional use of L LE   Baseline 51/80   Time 4   Period Weeks   PT LONG TERM GOAL #3   Title pt's bilateral hamstring length will improve to least 60 degrees to improve mobility   Baseline R: 38 degrees Left 45 degrees   Period Weeks   Status New  Plan - 06/24/15 1104    Clinical Impression Statement session consisted of manual therapy to left hamstring to decrease pain/tension, hamstring stretch and nuero moblizatoin to decrease neural tension in left LE.  pt reported decreased pain post treatment.    Pt will benefit from skilled therapeutic intervention in order to improve on the following deficits Decreased range of motion;Pain;Decreased mobility;Decreased strength;Hypomobility;Impaired flexibility   Rehab Potential Good   PT Frequency 2x / week   PT Duration 4 weeks   PT Treatment/Interventions Moist Heat;Cryotherapy;Electrical Stimulation;Iontophoresis 58m/ml Dexamethasone;Aquatic Therapy;Ultrasound;Gait training;Functional mobility training;Therapeutic activities;Therapeutic exercise;Manual techniques;Neuromuscular re-education;Balance training;Passive range of motion;Dry needling;Taping   PT Next Visit Plan manual therapy, neural "flossing"          Problem List Patient Active Problem List   Diagnosis Date Noted  . Routine general medical examination at a health care facility 08/12/2013  . Menopausal disorder 07/29/2012  . Alopecia areata 07/24/2011   JRenford Dills SPT This entire session was performed under direct supervision and direction of a licensed therapist/therapist assistant . I have personally read, edited and approve of the note as written. AGorden Harms Tortorici, PT, DPT #[id number] Tortorici,Ashley 06/24/2015, 1:45 PM  [place] [facility] HospitalSERVICES [address] [address]Phone: [phone]  Fax:  [phone]

## 2015-06-28 ENCOUNTER — Ambulatory Visit: Payer: 59

## 2015-06-28 DIAGNOSIS — M25552 Pain in left hip: Secondary | ICD-10-CM

## 2015-06-28 DIAGNOSIS — M25669 Stiffness of unspecified knee, not elsewhere classified: Secondary | ICD-10-CM

## 2015-06-28 NOTE — Therapy (Signed)
[place] MAIN [facility] SERVICES [address] [place], [place], [address] Phone: [phone]   Fax:  [phone]  Physical Therapy Treatment  Patient Details  Name: [name] [name] MRN: [id number] Date of Birth: Nov 24, 1950 Referring Provider:  Hessie Knows, MD  Encounter Date: 06/28/2015      PT End of Session - 06/28/15 1812    Visit Number 4   Number of Visits 9   Date for PT Re-Evaluation 07/13/15   PT Start Time 3016   PT Stop Time 1730   PT Time Calculation (min) 40 min   Activity Tolerance Patient tolerated treatment well   Behavior During Therapy [facility] for tasks assessed/performed      Past Medical History  Diagnosis Date  . Alopecia     Followed by Dr. Kellie Moor, s/p kenalog injection    Past Surgical History  Procedure Laterality Date  . Novasure ablation  2011    Dr. Rayford Halsted  . Ligation / division saphenous vein    . Breast surgery  2004    right breast biopsy, benign    There were no vitals filed for this visit.  Visit Diagnosis:  Left hip pain  Decreased ROM of lower extremity      Subjective Assessment - 06/28/15 1810    Subjective pt relates she is doing well this afternoon and currently has no pain or sorenss.  pt reports she ran 6 miles yesterday on the treadmill and did not experience any pain.  pt relates PT has been helping with her posterior hip pain.    Currently in Pain? No/denies   Pain Score 0-No pain   Pain Onset More than a month ago        Manual therapy: Soft tissue mobilization to left hamstring (efflurage, petrissage, muscle stripping)  Pt's tension and soreness in biceps femoris and semitendinosus decreased with manual therapy  Dry needling performed by staff physical therapist certified in dry needling. Patient received dry needling therapy education and acknowledged understanding of risks and benefits of dry needling therapy prior to receiving treatment. Patient voiced understanding of  treatment options and elected to proceed with dry needling therapy.  Trigger point dry needling to the hamstrings to decrease myofascial trigger point. Pt reported normal deep aching sensation and latent twitch response to 3/3 areas needled.  MET to increase hamstring length 2x 4 minutes in supine  Therex: Left hamstring stretch 2x30 sec                           PT Education - 06/28/15 1811    Education provided Yes   Education Details the benefits of dry needling and potential risks   Person(s) Educated Patient   Methods Explanation   Comprehension Verbalized understanding             PT Long Term Goals - 06/16/15 1700    PT LONG TERM GOAL #1   Title pt will be able to run 5 miles with <3/10 pain and without limb in order to return to prior level of function   Baseline has stopped running due to pain and limb   Time 4   Period Weeks   Status New   PT LONG TERM GOAL #2   Title pt's LEFS score will improve by at least 10 points demonstrating improved functional use of L LE   Baseline 51/80   Time 4   Period Weeks   PT LONG TERM  GOAL #3   Title pt's bilateral hamstring length will improve to least 60 degrees to improve mobility   Baseline R: 38 degrees Left 45 degrees   Period Weeks   Status New               Plan - 06/28/15 1817    Clinical Impression Statement session primairly consisted of manual therapy to decrease hamstring tightness and improve hamstring length to decrease posterior hip pain.  pt tolerated dry needling well with minor increase in pain during procedure and no pain post treatment.     Pt will benefit from skilled therapeutic intervention in order to improve on the following deficits Decreased range of motion;Pain;Decreased mobility;Decreased strength;Hypomobility;Impaired flexibility   Rehab Potential Good   PT Frequency 2x / week   PT Duration 4 weeks   PT Treatment/Interventions Moist Heat;Cryotherapy;Electrical  Stimulation;Iontophoresis 52m/ml Dexamethasone;Aquatic Therapy;Ultrasound;Gait training;Functional mobility training;Therapeutic activities;Therapeutic exercise;Manual techniques;Neuromuscular re-education;Balance training;Passive range of motion;Dry needling;Taping   PT Next Visit Plan manual therapy        Problem List Patient Active Problem List   Diagnosis Date Noted  . Routine general medical examination at a health care facility 08/12/2013  . Menopausal disorder 07/29/2012  . Alopecia areata 07/24/2011   JRenford Dills SPT This entire session was performed under direct supervision and direction of a licensed therapist/therapist assistant . I have personally read, edited and approve of the note as written. AGorden Harms Tortorici, PT, DPT #[id number] Tortorici,Ashley 06/29/2015, 1:46 PM  [place] [facility] Surgery[facility] [address] [place] [address]Phone: [phone]  Fax:  [phone]

## 2015-06-30 ENCOUNTER — Ambulatory Visit: Payer: 59

## 2015-06-30 DIAGNOSIS — M25552 Pain in left hip: Secondary | ICD-10-CM

## 2015-06-30 DIAGNOSIS — M25669 Stiffness of unspecified knee, not elsewhere classified: Secondary | ICD-10-CM

## 2015-07-01 NOTE — Therapy (Signed)
[place] MAIN [facility] SERVICES [address] [place], [place], [address] Phone: [phone]   Fax:  [phone]  Physical Therapy Treatment  Patient Details  Name: [name] [name] MRN: [id number] Date of Birth: 13-Jul-1951 Referring Provider:  Hessie Knows, MD  Encounter Date: 06/30/2015      PT End of Session - 07/01/15 1146    Visit Number 5   Number of Visits 9   Date for PT Re-Evaluation 07/13/15   PT Start Time 6962   PT Stop Time 1730   PT Time Calculation (min) 40 min   Activity Tolerance Patient tolerated treatment well   Behavior During Therapy [facility] for tasks assessed/performed      Past Medical History  Diagnosis Date  . Alopecia     Followed by Dr. Kellie Moor, s/p kenalog injection    Past Surgical History  Procedure Laterality Date  . Novasure ablation  2011    Dr. Rayford Halsted  . Ligation / division saphenous vein    . Breast surgery  2004    right breast biopsy, benign    There were no vitals filed for this visit.  Visit Diagnosis:  Left hip pain  Decreased ROM of lower extremity      Subjective Assessment - 06/30/15 1648    Subjective pt reports she was sore after her last session which resolved the following morning and reports the dry needling may have helped.  pt denies any current pain.  pt relates she has anterior proximal left hip pain when ascending stairs.    Currently in Pain? No/denies   Pain Score 0-No pain   Pain Onset More than a month ago        Manual therapy: Soft tissue mobilization to left hamstring (efflurage, petrissage, muscle stripping)  Pt's tension and soreness in biceps femoris and semitendinosus decreased with manual therapy  MET to increase hamstring length x 4 minutes in supine Grade 2-3 left hip mobs anterior,long axis, and lateral glides 2x 30 sec each  pt noted decreased pain in left hip  There ex: Clamshells 2x10 bilaterally Bridge 2x10 Ascend/descend 5 steps x4 Pt  required verbal and tactile cueing for correct form with clamshells and bridge.                           PT Education - 07/01/15 1145    Education provided Yes   Education Details benefits of joint mobs and strengthening hips    Person(s) Educated Patient   Methods Explanation   Comprehension Verbalized understanding             PT Long Term Goals - 06/16/15 1700    PT LONG TERM GOAL #1   Title pt will be able to run 5 miles with <3/10 pain and without limb in order to return to prior level of function   Baseline has stopped running due to pain and limb   Time 4   Period Weeks   Status New   PT LONG TERM GOAL #2   Title pt's LEFS score will improve by at least 10 points demonstrating improved functional use of L LE   Baseline 51/80   Time 4   Period Weeks   PT LONG TERM GOAL #3   Title pt's bilateral hamstring length will improve to least 60 degrees to improve mobility   Baseline R: 38 degrees Left 45 degrees   Period Weeks   Status New  Plan - 07/01/15 1146    Clinical Impression Statement pt responded well to joint mobs by experiencing decreased anterior hip pain while ascending/descending stairs.  pt did not eperience and increase in hip with hip strengthening exercises.     Pt will benefit from skilled therapeutic intervention in order to improve on the following deficits Decreased range of motion;Pain;Decreased mobility;Decreased strength;Hypomobility;Impaired flexibility   Rehab Potential Good   PT Frequency 2x / week   PT Duration 4 weeks   PT Treatment/Interventions Moist Heat;Cryotherapy;Electrical Stimulation;Iontophoresis 19m/ml Dexamethasone;Aquatic Therapy;Ultrasound;Gait training;Functional mobility training;Therapeutic activities;Therapeutic exercise;Manual techniques;Neuromuscular re-education;Balance training;Passive range of motion;Dry needling;Taping   PT Next Visit Plan manual therapy, progress HEP         Problem List Patient Active Problem List   Diagnosis Date Noted  . Routine general medical examination at a health care facility 08/12/2013  . Menopausal disorder 07/29/2012  . Alopecia areata 07/24/2011   JRenford Dills SPT This entire session was performed under direct supervision and direction of a licensed therapist/therapist assistant . I have personally read, edited and approve of the note as written. AGorden Harms Tortorici, PT, DPT #[id number] Tortorici,Ashley 07/01/2015, 4:21 PM  [place] [facility] HospitalSERV[facility] [address] [id number]: [phone]  Fax:  [phone]

## 2015-07-06 ENCOUNTER — Ambulatory Visit: Payer: 59

## 2015-07-06 DIAGNOSIS — M25669 Stiffness of unspecified knee, not elsewhere classified: Secondary | ICD-10-CM

## 2015-07-06 DIAGNOSIS — M25552 Pain in left hip: Secondary | ICD-10-CM

## 2015-07-06 NOTE — Therapy (Signed)
[place] MAIN [facility] SERVICES [address] [place], [place], [address] Phone: [phone]   Fax:  [phone]  Physical Therapy Treatment  Patient Details  Name: [name] [name] MRN: [id number] Date of Birth: Dec 05, 1950 Referring Provider:  Hessie Knows, MD  Encounter Date: 07/06/2015      PT End of Session - 07/06/15 1941    Visit Number 6   Number of Visits 9   Date for PT Re-Evaluation 07/13/15   PT Start Time 1604   PT Stop Time 1645   PT Time Calculation (min) 41 min   Activity Tolerance Patient tolerated treatment well   Behavior During Therapy [facility] for tasks assessed/performed      Past Medical History  Diagnosis Date  . Alopecia     Followed by Dr. Kellie Moor, s/p kenalog injection    Past Surgical History  Procedure Laterality Date  . Novasure ablation  2011    Dr. Rayford Halsted  . Ligation / division saphenous vein    . Breast surgery  2004    right breast biopsy, benign    There were no vitals filed for this visit.  Visit Diagnosis:  Left hip pain  Decreased ROM of lower extremity      Subjective Assessment - 07/06/15 1939    Subjective pt reports she is sore this afternoon especially in the lateral aspect of her left hip.  pt relates she ran ~ 12 miles this weekend and did not experience any posterior hip pain.  pt does not anterior hip pain with ascending steps or flexing L LE.     Currently in Pain? Yes   Pain Score 4    Pain Location Hip   Pain Orientation Left   Pain Onset More than a month ago      Manual therapy: Ischemic compression to left piriformis  MET to left hip flexors in supine x 5 minutes Grade 2-3 left hip mobs anterior,long axis, and lateral glides 3x 30 sec each  Passive left hp internal/external rotation, limited in both directions and tight.  pt noted decreased pain with manual therapy   There ex: Left piriformis stretch in supine 4x30 sec Ascend/descend 5 steps x4 Pt required  verbal and tactile cueing for correct form stretch                         PT Education - 07/06/15 1940    Education provided Yes   Education Details plan of care and piriformis stretch   Person(s) Educated Patient   Methods Explanation;Demonstration   Comprehension Verbalized understanding;Returned demonstration             PT Long Term Goals - 06/16/15 1700    PT LONG TERM GOAL #1   Title pt will be able to run 5 miles with <3/10 pain and without limb in order to return to prior level of function   Baseline has stopped running due to pain and limb   Time 4   Period Weeks   Status New   PT LONG TERM GOAL #2   Title pt's LEFS score will improve by at least 10 points demonstrating improved functional use of L LE   Baseline 51/80   Time 4   Period Weeks   PT LONG TERM GOAL #3   Title pt's bilateral hamstring length will improve to least 60 degrees to improve mobility   Baseline R: 38 degrees Left 45 degrees   Period Weeks  Status New               Plan - 07/06/15 1942    Clinical Impression Statement pt is running without an increase in posterior hip pain and main complaint currently is lateral hip pain while ascending steps or lifting L LE.  pt responded well to ichemic compression to myofacial trigger point in piriformis and piriformis stretch by noting decreaed pain while ascending steps.     Pt will benefit from skilled therapeutic intervention in order to improve on the following deficits Decreased range of motion;Pain;Decreased mobility;Decreased strength;Hypomobility;Impaired flexibility   Rehab Potential Good   PT Frequency 2x / week   PT Duration 4 weeks   PT Treatment/Interventions Moist Heat;Cryotherapy;Electrical Stimulation;Iontophoresis 1m/ml Dexamethasone;Aquatic Therapy;Ultrasound;Gait training;Functional mobility training;Therapeutic activities;Therapeutic exercise;Manual techniques;Neuromuscular re-education;Balance  training;Passive range of motion;Dry needling;Taping   PT Next Visit Plan manual therapy, progress HEP, discharge         Problem List Patient Active Problem List   Diagnosis Date Noted  . Routine general medical examination at a health care facility 08/12/2013  . Menopausal disorder 07/29/2012  . Alopecia areata 07/24/2011   JRenford Dills SPT This entire session was performed under direct supervision and direction of a licensed therapist/therapist assistant . I have personally read, edited and approve of the note as written. AGorden Harms [name], PT, DPT #[id number] [name],[name] 07/07/2015, 10:45 AM  [facility]MAIN [facility] LLCSERVICES [address]lyss[address] [place] [address]Phone: [phone]  Fax:  [phone]

## 2015-07-08 ENCOUNTER — Ambulatory Visit: Payer: 59

## 2015-07-08 DIAGNOSIS — M25669 Stiffness of unspecified knee, not elsewhere classified: Secondary | ICD-10-CM

## 2015-07-08 DIAGNOSIS — M25552 Pain in left hip: Secondary | ICD-10-CM | POA: Diagnosis not present

## 2015-07-08 NOTE — Patient Instructions (Signed)
[email or web address] Piriformis stretch in sitting 3x30 sec Hip abduction and extension with red band 2x10

## 2015-07-08 NOTE — Therapy (Signed)
[place] MAIN [facility] SERVICES [address] [place], [place], [address] Phone: [phone]   Fax:  [phone]  Physical Therapy Treatment/Discharge Note 8/3-25/2016   Patient Details  Name: [name] [name] MRN: [id number] Date of Birth: [birth year] Referring Provider:  Hessie Knows, MD  Encounter Date: 07/08/2015      PT End of Session - 07/08/15 1655    Visit Number 7   Number of Visits 9   Date for PT Re-Evaluation 07/13/15   PT Start Time 5465   PT Stop Time 1728   PT Time Calculation (min) 38 min   Activity Tolerance Patient tolerated treatment well   Behavior During Therapy [facility] for tasks assessed/performed      Past Medical History  Diagnosis Date  . Alopecia     Followed by Dr. Kellie Moor, s/p kenalog injection    Past Surgical History  Procedure Laterality Date  . Novasure ablation  2011    Dr. Rayford Halsted  . Ligation / division saphenous vein    . Breast surgery  2004    right breast biopsy, benign    There were no vitals filed for this visit.  Visit Diagnosis:  Left hip pain  Decreased ROM of lower extremity      Subjective Assessment - 07/08/15 1653    Subjective pt reports the piriformis stretch helped "a lot" with her anteior hip pain.  pt reports she stretched stretched after running and then tried ascending/descending stairs and did not experience any hip pain. pt denies any hip pain with running. pt reports her left posterior hip feels "achy" with 1-2/10 pain currently    Pain Score 2    Pain Location Hip   Pain Orientation Left   Pain Onset More than a month ago        Manual therapy: Soft tissue mobilization to left piriformis  Passive piriformis stretching 3x30 sec Decrease tension and pain noted after soft tissue mobilization and stretching  pt noted decreased pain with manual therapy   There ex: Left piriformis stretch in sitting 2x30 sec Hip abduction and extension in //bars x10 each Hip  abduction and extension in //bars with red band x10 each  Pt required verbal and tactile cueing for correct technique    Hamstring length Right: 45 Left: 50  LEFS: 79                               PT Education - 07/08/15 1758    Education provided Yes   Education Details plan of care, new HEP exercises (piriformis stretch, hip abduciton, hip extension)   Person(s) Educated Patient   Methods Explanation;Demonstration   Comprehension Verbalized understanding;Returned demonstration             PT Long Term Goals - 07/08/15 1759    PT LONG TERM GOAL #1   Title pt will be able to run 5 miles with <3/10 pain and without limb in order to return to prior level of function   Baseline pt reported running 6 miles on consecutive days without pain    Time 4   Period Weeks   Status Achieved   PT LONG TERM GOAL #2   Title pt's LEFS score will improve by at least 10 points demonstrating improved functional use of L LE   Baseline 79/80   Time 4   Period Weeks   Status Achieved   PT LONG TERM GOAL #3  Title pt's bilateral hamstring length will improve to least 60 degrees to improve mobility   Baseline R: 45 degrees Left 50 degrees   Period Weeks   Status Partially Met               Plan - 07/08/15 1803    Clinical Impression Statement pt has progressed well with PT and has met her primary goal of running without pain and partially met her hamstring length goal.  pt presents with increased tension in left piriformis and pt reports significant decrease in posterior and anterior hip pain with piriformis stretching.  pt denis any functional limitiations at this time and will be dishcarged from PT with an HEP to maintain an improve gains made with PT   Pt will benefit from skilled therapeutic intervention in order to improve on the following deficits Decreased range of motion;Pain;Decreased mobility;Decreased strength;Hypomobility;Impaired flexibility    Rehab Potential Good   PT Treatment/Interventions Moist Heat;Cryotherapy;Electrical Stimulation;Iontophoresis 88m/ml Dexamethasone;Aquatic Therapy;Ultrasound;Gait training;Functional mobility training;Therapeutic activities;Therapeutic exercise;Manual techniques;Neuromuscular re-education;Balance training;Passive range of motion;Dry needling;Taping        Problem List Patient Active Problem List   Diagnosis Date Noted  . Routine general medical examination at a health care facility 08/12/2013  . Menopausal disorder 07/29/2012  . Alopecia areata 07/24/2011   JRenford Dills SPT This entire session was performed under direct supervision and direction of a licensed therapist/therapist assistant . I have personally read, edited and approve of the note as written. AGorden Harms Tortorici, PT, DPT #[id number] Tortorici,Ashley 07/08/2015, 6:16 PM  [place] [facility]MAIN RMober[facility] LLCSERVICES [address] [address]Phone: [phone]  Fax:  [phone]

## 2015-08-27 ENCOUNTER — Encounter: Payer: Self-pay | Admitting: Internal Medicine

## 2015-08-27 ENCOUNTER — Ambulatory Visit (INDEPENDENT_AMBULATORY_CARE_PROVIDER_SITE_OTHER): Payer: 59 | Admitting: Internal Medicine

## 2015-08-27 VITALS — BP 139/83 | HR 61 | Temp 98.2°F | Resp 16 | Ht 63.0 in | Wt 141.0 lb

## 2015-08-27 DIAGNOSIS — Z0000 Encounter for general adult medical examination without abnormal findings: Secondary | ICD-10-CM

## 2015-08-27 DIAGNOSIS — M2550 Pain in unspecified joint: Secondary | ICD-10-CM | POA: Insufficient documentation

## 2015-08-27 LAB — LIPID PANEL
CHOL/HDL RATIO: 2.8 ratio (ref <=5.0)
Cholesterol: 201 mg/dL — ABNORMAL HIGH (ref 125–200)
HDL: 73 mg/dL (ref >=46)
LDL Cholesterol: 112 mg/dL (ref <130)
Triglycerides: 81 mg/dL (ref <150)
VLDL: 16 mg/dL (ref <30)

## 2015-08-27 LAB — CBC WITH DIFFERENTIAL/PLATELET
BASOS ABS: 0 10*3/uL (ref 0.0–0.1)
BASOS PCT: 1 % (ref 0–1)
EOS ABS: 0.2 10*3/uL (ref 0.0–0.7)
EOS PCT: 4 % (ref 0–5)
HCT: 42.2 % (ref 36.0–46.0)
Hemoglobin: 14.1 g/dL (ref 12.0–15.0)
LYMPHS PCT: 32 % (ref 12–46)
Lymphs Abs: 1.4 10*3/uL (ref 0.7–4.0)
MCH: 29.2 pg (ref 26.0–34.0)
MCHC: 33.4 g/dL (ref 30.0–36.0)
MCV: 87.4 fL (ref 78.0–100.0)
MPV: 11.1 fL (ref 8.6–12.4)
Monocytes Absolute: 0.4 10*3/uL (ref 0.1–1.0)
Monocytes Relative: 9 % (ref 3–12)
Neutro Abs: 2.4 10*3/uL (ref 1.7–7.7)
Neutrophils Relative %: 54 % (ref 43–77)
Platelets: 238 10*3/uL (ref 150–400)
RBC: 4.83 MIL/uL (ref 3.87–5.11)
RDW: 13.2 % (ref 11.5–15.5)
WBC: 4.4 10*3/uL (ref 4.0–10.5)

## 2015-08-27 LAB — COMPREHENSIVE METABOLIC PANEL
ALK PHOS: 73 U/L (ref 33–130)
ALT: 21 U/L (ref 6–29)
AST: 28 U/L (ref 10–35)
Albumin: 4.3 g/dL (ref 3.6–5.1)
BILIRUBIN TOTAL: 0.5 mg/dL (ref 0.2–1.2)
BUN: 18 mg/dL (ref 7–25)
CO2: 30 mmol/L (ref 20–31)
Calcium: 9.8 mg/dL (ref 8.6–10.4)
Chloride: 100 mmol/L (ref 98–110)
Creat: 0.81 mg/dL (ref 0.50–0.99)
GLUCOSE: 75 mg/dL (ref 65–99)
Potassium: 4.3 mmol/L (ref 3.5–5.3)
SODIUM: 140 mmol/L (ref 135–146)
Total Protein: 6.9 g/dL (ref 6.1–8.1)

## 2015-08-27 LAB — C-REACTIVE PROTEIN

## 2015-08-27 LAB — TSH: TSH: 2.677 u[IU]/mL (ref 0.350–4.500)

## 2015-08-27 LAB — RHEUMATOID FACTOR: Rhuematoid fact SerPl-aCnc: <10 IU/mL (ref <=14)

## 2015-08-27 MED ORDER — MELOXICAM 15 MG PO TABS: 15.0000 mg | ORAL_TABLET | Freq: Every day | ORAL | Status: DC

## 2015-08-27 NOTE — Patient Instructions (Addendum)
Start Meloxicam $RemoveBeforeD'15mg'[id number]$  daily.   Health Maintenance, Female Adopting a healthy lifestyle and getting preventive care can go a long way to promote health and wellness. Talk with your health care provider about what schedule of regular examinations is right for you. This is a good chance for you to check in with your provider about disease prevention and staying healthy. In between checkups, there are plenty of things you can do on your own. Experts have done a lot of research about which lifestyle changes and preventive measures are most likely to keep you healthy. Ask your health care provider for more information. WEIGHT AND DIET  Eat a healthy diet  Be sure to include plenty of vegetables, fruits, low-fat dairy products, and lean protein.  Do not eat a lot of foods high in solid fats, added sugars, or salt.  Get regular exercise. This is one of the most important things you can do for your health.  Most adults should exercise for at least 150 minutes each week. The exercise should increase your heart rate and make you sweat (moderate-intensity exercise).  Most adults should also do strengthening exercises at least twice a week. This is in addition to the moderate-intensity exercise.  Maintain a healthy weight  Body mass index (BMI) is a measurement that can be used to identify possible weight problems. It estimates body fat based on height and weight. Your health care provider can help determine your BMI and help you achieve or maintain a healthy weight.  For females 73 years of age and older:   A BMI below 18.5 is considered underweight.  A BMI of 18.5 to 24.9 is normal.  A BMI of 25 to 29.9 is considered overweight.  A BMI of 30 and above is considered obese.  Watch levels of cholesterol and blood lipids  You should start having your blood tested for lipids and cholesterol at 62 years of age, then have this test every 5 years.  You may need to have your cholesterol levels  checked more often if:  Your lipid or cholesterol levels are high.  You are older than 62 years of age.  You are at high risk for heart disease.  CANCER SCREENING   Lung Cancer  Lung cancer screening is recommended for adults 46-36 years old who are at high risk for lung cancer because of a history of smoking.  A yearly low-dose CT scan of the lungs is recommended for people who:  Currently smoke.  Have quit within the past 15 years.  Have at least a 30-pack-year history of smoking. A pack year is smoking an average of one pack of cigarettes a day for 1 year.  Yearly screening should continue until it has been 15 years since you quit.  Yearly screening should stop if you develop a health problem that would prevent you from having lung cancer treatment.  Breast Cancer  Practice breast self-awareness. This means understanding how your breasts normally appear and feel.  It also means doing regular breast self-exams. Let your health care provider know about any changes, no matter how small.  If you are in your 20s or 30s, you should have a clinical breast exam (CBE) by a health care provider every 1-3 years as part of a regular health exam.  If you are 84 or older, have a CBE every year. Also consider having a breast X-ray (mammogram) every year.  If you have a family history of breast cancer, talk to your health care provider  about genetic screening.  If you are at high risk for breast cancer, talk to your health care provider about having an MRI and a mammogram every year.  Breast cancer gene (BRCA) assessment is recommended for women who have family members with BRCA-related cancers. BRCA-related cancers include:  Breast.  Ovarian.  Tubal.  Peritoneal cancers.  Results of the assessment will determine the need for genetic counseling and BRCA1 and BRCA2 testing. Cervical Cancer Your health care provider may recommend that you be screened regularly for cancer of the  pelvic organs (ovaries, uterus, and vagina). This screening involves a pelvic examination, including checking for microscopic changes to the surface of your cervix (Pap test). You may be encouraged to have this screening done every 3 years, beginning at age 44.  For women ages 10-65, health care providers may recommend pelvic exams and Pap testing every 3 years, or they may recommend the Pap and pelvic exam, combined with testing for human papilloma virus (HPV), every 5 years. Some types of HPV increase your risk of cervical cancer. Testing for HPV may also be done on women of any age with unclear Pap test results.  Other health care providers may not recommend any screening for nonpregnant women who are considered low risk for pelvic cancer and who do not have symptoms. Ask your health care provider if a screening pelvic exam is right for you.  If you have had past treatment for cervical cancer or a condition that could lead to cancer, you need Pap tests and screening for cancer for at least 20 years after your treatment. If Pap tests have been discontinued, your risk factors (such as having a new sexual partner) need to be reassessed to determine if screening should resume. Some women have medical problems that increase the chance of getting cervical cancer. In these cases, your health care provider may recommend more frequent screening and Pap tests. Colorectal Cancer  This type of cancer can be detected and often prevented.  Routine colorectal cancer screening usually begins at 62 years of age and continues through 62 years of age.  Your health care provider may recommend screening at an earlier age if you have risk factors for colon cancer.  Your health care provider may also recommend using home test kits to check for hidden blood in the stool.  A small camera at the end of a tube can be used to examine your colon directly (sigmoidoscopy or colonoscopy). This is done to check for the earliest  forms of colorectal cancer.  Routine screening usually begins at age 64.  Direct examination of the colon should be repeated every 5-10 years through 62 years of age. However, you may need to be screened more often if early forms of precancerous polyps or small growths are found. Skin Cancer  Check your skin from head to toe regularly.  Tell your health care provider about any new moles or changes in moles, especially if there is a change in a mole's shape or color.  Also tell your health care provider if you have a mole that is larger than the size of a pencil eraser.  Always use sunscreen. Apply sunscreen liberally and repeatedly throughout the day.  Protect yourself by wearing long sleeves, pants, a wide-brimmed hat, and sunglasses whenever you are outside. HEART DISEASE, DIABETES, AND HIGH BLOOD PRESSURE   High blood pressure causes heart disease and increases the risk of stroke. High blood pressure is more likely to develop in:  People who have  blood pressure in the high end of the normal range (130-139/85-89 mm Hg).  People who are overweight or obese.  People who are African American.  If you are 88-65 years of age, have your blood pressure checked every 3-5 years. If you are 42 years of age or older, have your blood pressure checked every year. You should have your blood pressure measured twice--once when you are at a hospital or clinic, and once when you are not at a hospital or clinic. Record the average of the two measurements. To check your blood pressure when you are not at a hospital or clinic, you can use:  An automated blood pressure machine at a pharmacy.  A home blood pressure monitor.  If you are between 73 years and 22 years old, ask your health care provider if you should take aspirin to prevent strokes.  Have regular diabetes screenings. This involves taking a blood sample to check your fasting blood sugar level.  If you are at a normal weight and have a low  risk for diabetes, have this test once every three years after 62 years of age.  If you are overweight and have a high risk for diabetes, consider being tested at a younger age or more often. PREVENTING INFECTION  Hepatitis B  If you have a higher risk for hepatitis B, you should be screened for this virus. You are considered at high risk for hepatitis B if:  You were born in a country where hepatitis B is common. Ask your health care provider which countries are considered high risk.  Your parents were born in a high-risk country, and you have not been immunized against hepatitis B (hepatitis B vaccine).  You have HIV or AIDS.  You use needles to inject street drugs.  You live with someone who has hepatitis B.  You have had sex with someone who has hepatitis B.  You get hemodialysis treatment.  You take certain medicines for conditions, including cancer, organ transplantation, and autoimmune conditions. Hepatitis C  Blood testing is recommended for:  Everyone born from [birth year] through 1963.  Anyone with known risk factors for hepatitis C. Sexually transmitted infections (STIs)  You should be screened for sexually transmitted infections (STIs) including gonorrhea and chlamydia if:  You are sexually active and are younger than 62 years of age.  You are older than 62 years of age and your health care provider tells you that you are at risk for this type of infection.  Your sexual activity has changed since you were last screened and you are at an increased risk for chlamydia or gonorrhea. Ask your health care provider if you are at risk.  If you do not have HIV, but are at risk, it may be recommended that you take a prescription medicine daily to prevent HIV infection. This is called pre-exposure prophylaxis (PrEP). You are considered at risk if:  You are sexually active and do not regularly use condoms or know the HIV status of your partner(s).  You take drugs by  injection.  You are sexually active with a partner who has HIV. Talk with your health care provider about whether you are at high risk of being infected with HIV. If you choose to begin PrEP, you should first be tested for HIV. You should then be tested every 3 months for as long as you are taking PrEP.  PREGNANCY   If you are premenopausal and you may become pregnant, ask your health care provider about  preconception counseling.  If you may become pregnant, take 400 to 800 micrograms (mcg) of folic acid every day.  If you want to prevent pregnancy, talk to your health care provider about birth control (contraception). OSTEOPOROSIS AND MENOPAUSE   Osteoporosis is a disease in which the bones lose minerals and strength with aging. This can result in serious bone fractures. Your risk for osteoporosis can be identified using a bone density scan.  If you are 70 years of age or older, or if you are at risk for osteoporosis and fractures, ask your health care provider if you should be screened.  Ask your health care provider whether you should take a calcium or vitamin D supplement to lower your risk for osteoporosis.  Menopause may have certain physical symptoms and risks.  Hormone replacement therapy may reduce some of these symptoms and risks. Talk to your health care provider about whether hormone replacement therapy is right for you.  HOME CARE INSTRUCTIONS   Schedule regular health, dental, and eye exams.  Stay current with your immunizations.   Do not use any tobacco products including cigarettes, chewing tobacco, or electronic cigarettes.  If you are pregnant, do not drink alcohol.  If you are breastfeeding, limit how much and how often you drink alcohol.  Limit alcohol intake to no more than 1 drink per day for nonpregnant women. One drink equals 12 ounces of beer, 5 ounces of wine, or 1 ounces of hard liquor.  Do not use street drugs.  Do not share needles.  Ask your  health care provider for help if you need support or information about quitting drugs.  Tell your health care provider if you often feel depressed.  Tell your health care provider if you have ever been abused or do not feel safe at home.   This information is not intended to replace advice given to you by your health care provider. Make sure you discuss any questions you have with your health care provider.   Document Released: 05/15/2011 Document Revised: 11/20/2014 Document Reviewed: 10/01/2013 Elsevier Interactive Patient Education [employer].

## 2015-08-27 NOTE — Progress Notes (Signed)
Pre visit review using our clinic review tool, if applicable. No additional management support is needed unless otherwise documented below in the visit note. 

## 2015-08-27 NOTE — Progress Notes (Signed)
Subjective:    Patient ID: [name] [name], female    DOB: 17-May-1951, 64 y.o.   MRN: [id number]  HPI  64YO female presents for annual exam.  Diffuse arthralgia in hips and knees, starting this summer. No known injury. Continues to run. Takes Ibuprofen or Tylenol with some improvement. Described as aching pain mostly in hips and knees.   Wt Readings from Last 3 Encounters:  08/27/15 141 lb (63.957 kg)  08/20/14 139 lb (63.05 kg)  08/12/13 138 lb (62.596 kg)   BP Readings from Last 3 Encounters:  08/27/15 139/83  08/20/14 120/66  08/12/13 120/80    Past Medical History  Diagnosis Date  . Alopecia     Followed by Dr. Kellie Moor, s/p kenalog injection   Family History  Problem Relation Age of Onset  . Cancer Mother     lung ca, died in 37s  . Heart disease Father    Past Surgical History  Procedure Laterality Date  . Novasure ablation  2011    Dr. Rayford Halsted  . Ligation / division saphenous vein    . Breast surgery  2004    right breast biopsy, benign   Social History   Social History  . Marital Status: Married    Spouse Name: N/A  . Number of Children: N/A  . Years of Education: N/A   Social History Main Topics  . Smoking status: Never Smoker   . Smokeless tobacco: Never Used  . Alcohol Use: Yes     Comment: glass of wine every month or two  . Drug Use: No  . Sexual Activity: Not on file   Other Topics Concern  . Not on file   Social History Narrative    Review of Systems  Constitutional: Negative for fever, chills, appetite change, fatigue and unexpected weight change.  Eyes: Negative for visual disturbance.  Respiratory: Negative for shortness of breath.   Cardiovascular: Negative for chest pain and leg swelling.  Gastrointestinal: Negative for nausea, vomiting, abdominal pain, diarrhea and constipation.  Musculoskeletal: Positive for myalgias and arthralgias.  Skin: Negative for color change and rash.  Hematological: Negative for adenopathy.  Does not bruise/bleed easily.  Psychiatric/Behavioral: Negative for sleep disturbance and dysphoric mood. The patient is not nervous/anxious.        Objective:    BP 139/83 mmHg  Pulse 61  Temp(Src) 98.2 F (36.8 C)  Resp 16  Ht 5' 3"  (1.6 m)  Wt 141 lb (63.957 kg)  BMI 24.98 kg/m2  SpO2 100% Physical Exam  Constitutional: She is oriented to person, place, and time. She appears well-developed and well-nourished. No distress.  HENT:  Head: Normocephalic and atraumatic.  Right Ear: External ear normal.  Left Ear: External ear normal.  Nose: Nose normal.  Mouth/Throat: Oropharynx is clear and moist. No oropharyngeal exudate.  Eyes: Conjunctivae are normal. Pupils are equal, round, and reactive to light. Right eye exhibits no discharge. Left eye exhibits no discharge. No scleral icterus.  Neck: Normal range of motion. Neck supple. No tracheal deviation present. No thyromegaly present.  Cardiovascular: Normal rate, regular rhythm, normal heart sounds and intact distal pulses.  Exam reveals no gallop and no friction rub.   No murmur heard. Pulmonary/Chest: Effort normal and breath sounds normal. No accessory muscle usage. No tachypnea. No respiratory distress. She has no decreased breath sounds. She has no wheezes. She has no rales. She exhibits no tenderness. Right breast exhibits no inverted nipple, no mass, no nipple discharge, no skin change and  no tenderness. Left breast exhibits no inverted nipple, no mass, no nipple discharge, no skin change and no tenderness. Breasts are symmetrical.  Abdominal: Soft. Bowel sounds are normal. She exhibits no distension and no mass. There is no tenderness. There is no rebound and no guarding.  Musculoskeletal: Normal range of motion. She exhibits no edema or tenderness.  Lymphadenopathy:    She has no cervical adenopathy.  Neurological: She is alert and oriented to person, place, and time. No cranial nerve deficit. She exhibits normal muscle tone.  Coordination normal.  Skin: Skin is warm and dry. No rash noted. She is not diaphoretic. No erythema. No pallor.  Psychiatric: She has a normal mood and affect. Her behavior is normal. Judgment and thought content normal.          Assessment & Plan:   Problem List Items Addressed This Visit      Unprioritized   Arthralgia    Diffuse arthralgia. Exam normal. Will screen for RA with labs. Start Meloxicam 57m daily. Follow up prn.      Relevant Medications   meloxicam (MOBIC) 15 MG tablet   Other Relevant Orders   ANA   Sed Rate (ESR)   C-reactive protein   Rheumatoid Factor   Routine general medical examination at a health care facility - Primary    General medical exam including breast exam normal today. PAP and pelvic deferred, plan repeat PAP in 2018. Mammogram ordered. Labs as ordered. Encouraged healthy diet and exercise. Immunizations are UTD.      Relevant Orders   CBC with Differential/Platelet   Comprehensive metabolic panel   Lipid panel   Vit D  25 hydroxy (rtn osteoporosis monitoring)   TSH   MM Digital Screening       Return in about 1 year (around 08/26/2016) for Physical.

## 2015-08-27 NOTE — Assessment & Plan Note (Signed)
Diffuse arthralgia. Exam normal. Will screen for RA with labs. Start Meloxicam 15mg  daily. Follow up prn.

## 2015-08-27 NOTE — Assessment & Plan Note (Signed)
General medical exam including breast exam normal today. PAP and pelvic deferred, plan repeat PAP in 2018. Mammogram ordered. Labs as ordered. Encouraged healthy diet and exercise. Immunizations are UTD.

## 2015-08-28 LAB — SEDIMENTATION RATE: SED RATE: 5 mm/h (ref 0–30)

## 2015-08-28 LAB — VITAMIN D 25 HYDROXY (VIT D DEFICIENCY, FRACTURES): Vit D, 25-Hydroxy: 31 ng/mL (ref 30–100)

## 2015-08-31 LAB — ANA: Anti Nuclear Antibody(ANA): NEGATIVE

## 2015-09-23 ENCOUNTER — Ambulatory Visit: Payer: 59

## 2015-09-30 ENCOUNTER — Ambulatory Visit
Admission: RE | Admit: 2015-09-30 | Discharge: 2015-09-30 | Disposition: A | Discharge status: Home / Self Care | Payer: 59 | Source: Ambulatory Visit | Attending: Internal Medicine | Admitting: Internal Medicine

## 2015-09-30 ENCOUNTER — Other Ambulatory Visit: Payer: Self-pay | Admitting: Internal Medicine

## 2015-09-30 DIAGNOSIS — Z0000 Encounter for general adult medical examination without abnormal findings: Secondary | ICD-10-CM

## 2015-09-30 DIAGNOSIS — Z1231 Encounter for screening mammogram for malignant neoplasm of breast: Secondary | ICD-10-CM | POA: Insufficient documentation

## 2015-10-01 ENCOUNTER — Other Ambulatory Visit: Payer: Self-pay | Admitting: Internal Medicine

## 2015-10-01 DIAGNOSIS — R928 Other abnormal and inconclusive findings on diagnostic imaging of breast: Secondary | ICD-10-CM

## 2015-10-15 ENCOUNTER — Ambulatory Visit
Admission: RE | Admit: 2015-10-15 | Discharge: 2015-10-15 | Disposition: A | Discharge status: Home / Self Care | Payer: 59 | Source: Ambulatory Visit | Attending: Internal Medicine | Admitting: Internal Medicine

## 2015-10-15 DIAGNOSIS — R928 Other abnormal and inconclusive findings on diagnostic imaging of breast: Secondary | ICD-10-CM

## 2015-10-15 DIAGNOSIS — R921 Mammographic calcification found on diagnostic imaging of breast: Secondary | ICD-10-CM | POA: Insufficient documentation

## 2015-11-18 DIAGNOSIS — L638 Other alopecia areata: Secondary | ICD-10-CM | POA: Diagnosis not present

## 2015-12-15 ENCOUNTER — Encounter: Payer: Self-pay | Admitting: Internal Medicine

## 2015-12-15 DIAGNOSIS — M25551 Pain in right hip: Secondary | ICD-10-CM

## 2015-12-15 DIAGNOSIS — M25552 Pain in left hip: Principal | ICD-10-CM

## 2015-12-16 ENCOUNTER — Encounter: Payer: Self-pay | Admitting: Internal Medicine

## 2015-12-28 ENCOUNTER — Ambulatory Visit (INDEPENDENT_AMBULATORY_CARE_PROVIDER_SITE_OTHER)
Admission: RE | Admit: 2015-12-28 | Discharge: 2015-12-28 | Disposition: A | Discharge status: Home / Self Care | Payer: 59 | Source: Ambulatory Visit | Attending: Family Medicine | Admitting: Family Medicine

## 2015-12-28 ENCOUNTER — Ambulatory Visit (INDEPENDENT_AMBULATORY_CARE_PROVIDER_SITE_OTHER): Payer: 59 | Admitting: Family Medicine

## 2015-12-28 ENCOUNTER — Encounter: Payer: Self-pay | Admitting: Family Medicine

## 2015-12-28 VITALS — BP 128/72 | HR 71 | Ht 63.0 in | Wt 143.0 lb

## 2015-12-28 DIAGNOSIS — M25552 Pain in left hip: Secondary | ICD-10-CM | POA: Insufficient documentation

## 2015-12-28 DIAGNOSIS — G5702 Lesion of sciatic nerve, left lower limb: Secondary | ICD-10-CM | POA: Diagnosis not present

## 2015-12-28 DIAGNOSIS — M1612 Unilateral primary osteoarthritis, left hip: Secondary | ICD-10-CM | POA: Diagnosis not present

## 2015-12-28 DIAGNOSIS — M1990 Unspecified osteoarthritis, unspecified site: Secondary | ICD-10-CM

## 2015-12-28 MED ORDER — ALENDRONATE SODIUM 70 MG PO TABS: 70.0000 mg | ORAL_TABLET | ORAL | Status: DC

## 2015-12-28 MED ORDER — VITAMIN D (ERGOCALCIFEROL) 1.25 MG (50000 UNIT) PO CAPS: 50000.0000 [IU] | ORAL_CAPSULE | ORAL | Status: DC

## 2015-12-28 NOTE — Assessment & Plan Note (Signed)
Patient does have significant left hip pain. Describes it as more of a dull throbbing aching sensation that seems to be worsening. Has significant limitation in range of motion. I'm concerned the patient has arthritis or early avascular necrosis. Patient is having pain on the posterior aspect in the differential does include piriformis syndrome as well as lumbar radiculopathy but likes likely. Patient is given many different choices. X-rays are pending. Patient has elected to treat for the most concerning which would be avascular necrosis. Patient given Fosamax as well as vitamin D supplementation to take weekly. We discussed with patient about increasing the range of motion in patient given exercises. Work with Product/process development scientist. Patient will do more of an icing protocol. Patient and will follow-up with me again in 3 weeks. If any worsening symptoms we can either consider advanced imaging versus possible injection. This can help diagnostically as well as potentially therapeutically.

## 2015-12-28 NOTE — Assessment & Plan Note (Signed)
Left hip arthritis was seen on earlier x-rays. X-rays are pending to see if any further evaluation is necessary. Possible advancement could be occurring or possible avascular necrosis. Patient will come back and see me again in 3 weeks and we'll make sure patient is responding to conservative therapy.

## 2015-12-28 NOTE — Patient Instructions (Addendum)
Good to see you.  Xray of left hip today  Ice 20 minutes 2 times daily. Usually after activity and before bed. Exercises 3 times a week.  Avoid running and would rather have you on a elliptical or bike for now for exercises, but treadmill is ok.  Once weekly vitamin D for next 8 weeks  Fosamax for 8 weeks once weekly  Turmeric 500mg  twice daily, watch for any stomach discomfort Tart cherry extract at night any dose. pennsaid pinkie amount topically 2 times daily as needed. If you like it sen me a message and I will send it to a special pharmacy.  Come back and see me again in 3 weeks and if not better we can consider injection in hip or in the piriformis muscle if needed. We can also consider oral prednisone.

## 2015-12-28 NOTE — Progress Notes (Signed)
Zach Smith D.O. [facility] [address] [place], [place] [address] Phone: [phone] Subjective:    I'm seeing this patient by the request  of:  WALKER,JENNIFER AZBELL, MD   CC: bilateral hip pain  HPI:Subjective [name] [name] is a 65 y.o. female coming in with complaint of bilateral hip pain. Patient has had this pain for quite some time. Had been seen in July of last year by another provider. At that time did have x-rays. Report read patient did have calcific capsule of the hips bilaterally but otherwise some mild early osteoarthritic changes. Patient has been doing formal physical therapy. Patient states she continues to have difficult he. Seems that her left leg is worse than her right leg. Some groin pain as well as posterior and lateral pain of the hip. Has lost range of motion. He should states that she did not have any significant improvement with formal physical therapy. Feels like it is worsening slowly. Patient was an avid runner but no longer is having the confidence to be able to do this at this time. No radiation down the leg or any numbness or tingling. No associated back pain with it at this moment. Denies any fevers, chills, or any abnormal weight loss.    reviewing patient's chart she is also been worked up in October of last year to rule out autoimmune disease. Patient had a normal ANA, ESR, as well as rheumatoid factor. Thyroid unremarkable.  Past Medical History  Diagnosis Date  . Alopecia     Followed by Dr. Isenstein, s/p kenalog injection   Past Surgical History  Procedure Laterality Date  . Novasure ablation  2011    Dr. Kincius  . Ligation / division saphenous vein    . Breast surgery  2004    right breast biopsy, benign  . Breast biopsy Right 2004    benign   Social History   Social History  . Marital Status: Married    Spouse Name: N/A  . Number of Children: N/A  . Years of Education: N/A   Social History Main Topics  . Smoking  status: Never Smoker   . Smokeless tobacco: Never Used  . Alcohol Use: Yes     Comment: glass of wine every month or two  . Drug Use: No  . Sexual Activity: Not Asked   Other Topics Concern  . None   Social History Narrative   Allergies  Allergen Reactions  . Codeine     Rash   . Penicillins     rash   Family History  Problem Relation Age of Onset  . Cancer Mother     lung ca, died in 70s  . Heart disease Father     Past medical history, social, surgical and family history all reviewed in electronic medical record.  No pertanent information unless stated regarding to the chief complaint.   Review of Systems: No headache, visual changes, nausea, vomiting, diarrhea, constipation, dizziness, abdominal pain, skin rash, fevers, chills, night sweats, weight loss, swollen lymph nodes, body aches, joint swelling, muscle aches, chest pain, shortness of breath, mood changes.   Objective Blood pressure 128/72, pulse 71, height 5' 3" (1.6 m), weight 143 lb (64.864 kg), SpO2 97 %.  General: No apparent distress alert and oriented x3 mood and affect normal, dressed appropriately.  HEENT: Pupils equal, extraocular movements intact  Respiratory: Patient's speak in full sentences and does not appear short of breath  Cardiovascular: No lower extremity edema, non tender,   no erythema  Skin: Warm dry intact with no signs of infection or rash on extremities or on axial skeleton.  Abdomen: Soft nontender  Neuro: Cranial nerves II through XII are intact, neurovascularly intact in all extremities with 2+ DTRs and 2+ pulses.  Lymph: No lymphadenopathy of posterior or anterior cervical chain or axillae bilaterally.  Gait normal with good balance and coordination.  MSK:  Non tender with full range of motion and good stability and symmetric strength and tone of shoulders, elbows, wrist, ,knee and ankles bilaterally.   Hip:Left ROM IR: 5 Deg with pain but mostly posteriorly, ER: 35 Deg, Flexion: 120  Deg, Extension: 60 Deg, Abduction: 25 Deg, Adduction: 25 Deg Strength IR: 5/5, ER: 5/5, Flexion: 5/5, Extension: 5/5, Abduction: 5/5, Adduction: 5/5 Pelvic alignment unremarkable to inspection and palpation. Standing hip rotation and gait without trendelenburg sign / unsteadiness. Greater trochanter without tenderness to palpation. Positive Faber on the left side tenderness over the piriformis No SI joint tenderness and normal minimal SI movement. Contralateral hip has some mild lack of range of motion as well. Not as painful though.  Procedure note 97110; 15 minutes spent for Therapeutic exercises as stated in above notes.  This included exercises focusing on stretching, strengthening, with significant focus on eccentric aspects.  Piriformis Syndrome  Using an anatomical model, reviewed with the patient the structures involved and how they related to diagnosis. The patient indicated understanding.   The patient was given a handout from Dr. Rouzier's book "The Sports Medicine Patient Advisor" describing the anatomy and rehabilitation of the following condition: Piriformis Syndrome  Also given a handout with more extensive Piriformis stretching, hip flexor and abductor strengthening, ham stretching  Rec deep massage, explained self-massage with ball  Proper technique shown and discussed handout in great detail with ATC.  All questions were discussed and answered.     Impression and Recommendations:     This case required medical decision making of moderate complexity.      Note: This dictation was prepared with Dragon dictation along with smaller phrase technology. Any transcriptional errors that result from this process are unintentional.          

## 2015-12-28 NOTE — Progress Notes (Signed)
Pre visit review using our clinic review tool, if applicable. No additional management support is needed unless otherwise documented below in the visit note. 

## 2016-01-18 ENCOUNTER — Encounter: Payer: Self-pay | Admitting: Family Medicine

## 2016-01-18 ENCOUNTER — Ambulatory Visit (INDEPENDENT_AMBULATORY_CARE_PROVIDER_SITE_OTHER): Payer: 59 | Admitting: Family Medicine

## 2016-01-18 VITALS — BP 118/76 | HR 69 | Ht 63.0 in | Wt 141.0 lb

## 2016-01-18 DIAGNOSIS — M1612 Unilateral primary osteoarthritis, left hip: Secondary | ICD-10-CM

## 2016-01-18 DIAGNOSIS — M1990 Unspecified osteoarthritis, unspecified site: Secondary | ICD-10-CM | POA: Diagnosis not present

## 2016-01-18 MED ORDER — TRAMADOL HCL 50 MG PO TABS: 50.0000 mg | ORAL_TABLET | Freq: Every evening | ORAL | Status: DC | PRN

## 2016-01-18 NOTE — Assessment & Plan Note (Signed)
Discussed with patient again at great length. We discussed different treatment options for this pin likely only surgical intervention would be the only thing that would be curative. We discussed the possibility of injections with patient declined. We discussed formal physical therapy which patient declined. Patient is going to continue to put up with some of the pain at this time. Tramadol given fresh any breakthrough pain. We discussed the possibility of surgical intervention and referral which patient wanted to hold on at this time. If worsening symptoms she may make another appointment for an injection. Patient will continue with icing regimen. We'll follow-up with me more on an as-needed basis.  Spent  25 minutes with patient face-to-face and had greater than 50% of counseling including as described above in assessment and plan.

## 2016-01-18 NOTE — Patient Instructions (Addendum)
Good to see you  You do have bad arthritis of your hips.  1500mg  of glucosamine daily could help Tylenol 325mg  3 times daily  meloxicam as needed Dr Rhona Raider or dr Miguel Dibble.  definatley anterior approach in the long run.  We can consider injections.

## 2016-01-18 NOTE — Progress Notes (Signed)
Pre visit review using our clinic review tool, if applicable. No additional management support is needed unless otherwise documented below in the visit note. 

## 2016-01-18 NOTE — Progress Notes (Signed)
[name] [place], [place] [address] Phone: [phone] Subjective:    I'm seeing this patient by the request  of:  Rica Mast, MD   CC: Left hip pain follow-up  [id number] [name] [name] is a 65 y.o. female coming in with complaint of left hip pain. Patient was seen previously and was diagnosed with severe osteoarthritic changes of the hip. There is a concern for patient having avascular necrosis. Patient also has advanced arthritis of the contralateral hip. Patient elected try conservative therapy including home exercises, icing, over-the-counter medications. Patient states Even with the over-the-counter medications and some the exercises she has actually noticed that it seems to be progressing slowly. Patient still is not willing to try any type of surgical intervention. Patient though does state that now is having more bilateral groin pain. Affecting some of the daily activities. Denies any giving out on her. Denies any radiation. States that she does feel like she maybe week or possibly fatiguing earlier on with her exercises.    reviewing patient's chart she is also been worked up in October of last year to rule out autoimmune disease. Patient had a normal ANA, ESR, as well as rheumatoid factor. Thyroid unremarkable.  Past Medical History  Diagnosis Date  . Alopecia     Followed by Dr. Kellie Moor, s/p kenalog injection   Past Surgical History  Procedure Laterality Date  . Novasure ablation  2011    Dr. Rayford Halsted  . Ligation / division saphenous vein    . Breast surgery  2004    right breast biopsy, benign  . Breast biopsy Right 2004    benign   Social History   Social History  . Marital Status: Married    Spouse Name: N/A  . Number of Children: N/A  . Years of Education: N/A   Social History Main Topics  . Smoking status: Never Smoker   . Smokeless tobacco: Never Used  . Alcohol Use: Yes     Comment: glass of  wine every month or two  . Drug Use: No  . Sexual Activity: Not on file   Other Topics Concern  . Not on file   Social History Narrative   Allergies  Allergen Reactions  . Codeine     Rash   . Penicillins     rash   Family History  Problem Relation Age of Onset  . Cancer Mother     lung ca, died in 74s  . Heart disease Father     Past medical history, social, surgical and family history all reviewed in electronic medical record.  No pertanent information unless stated regarding to the chief complaint.   Review of Systems: No headache, visual changes, nausea, vomiting, diarrhea, constipation, dizziness, abdominal pain, skin rash, fevers, chills, night sweats, weight loss, swollen lymph nodes, body aches, joint swelling, muscle aches, chest pain, shortness of breath, mood changes.   Objective There were no vitals taken for this visit.  General: No apparent distress alert and oriented x3 mood and affect normal, dressed appropriately.  HEENT: Pupils equal, extraocular movements intact  Respiratory: Patient's speak in full sentences and does not appear short of breath  Cardiovascular: No lower extremity edema, non tender, no erythema  Skin: Warm dry intact with no signs of infection or rash on extremities or on axial skeleton.  Abdomen: Soft nontender  Neuro: Cranial nerves II through XII are intact, neurovascularly intact in all extremities with 2+ DTRs and 2+  pulses.  Lymph: No lymphadenopathy of posterior or anterior cervical chain or axillae bilaterally.  Gait normal with good balance and coordination.  MSK:  Non tender with full range of motion and good stability and symmetric strength and tone of shoulders, elbows, wrist, ,knee and ankles bilaterally.   NWG:NFAO ROM IR: 5 Deg with pain but mostly posteriorly, ER: 35 Deg, Flexion: 120 Deg, Extension: 60 Deg, Abduction: 25 Deg, Adduction: 25 Deg Strength IR: 5/5, ER: 5/5, Flexion: 5/5, Extension: 5/5, Abduction: 5/5,  Adduction: 5/5 Pelvic alignment unremarkable to inspection and palpation. Standing hip rotation and gait without trendelenburg sign / unsteadiness. Greater trochanter without tenderness to palpation. Positive Faber on the left side tenderness over the piriformis No SI joint tenderness and normal minimal SI movement. Contralateral hip has some mild lack of range of motionAnd is painful.      Impression and Recommendations:

## 2016-02-15 ENCOUNTER — Other Ambulatory Visit (INDEPENDENT_AMBULATORY_CARE_PROVIDER_SITE_OTHER): Payer: 59

## 2016-02-15 ENCOUNTER — Ambulatory Visit (INDEPENDENT_AMBULATORY_CARE_PROVIDER_SITE_OTHER): Payer: 59 | Admitting: Family Medicine

## 2016-02-15 ENCOUNTER — Encounter: Payer: Self-pay | Admitting: Family Medicine

## 2016-02-15 VITALS — BP 126/76 | HR 74 | Wt 142.0 lb

## 2016-02-15 DIAGNOSIS — M1990 Unspecified osteoarthritis, unspecified site: Secondary | ICD-10-CM | POA: Diagnosis not present

## 2016-02-15 DIAGNOSIS — M25552 Pain in left hip: Secondary | ICD-10-CM

## 2016-02-15 DIAGNOSIS — M1612 Unilateral primary osteoarthritis, left hip: Secondary | ICD-10-CM

## 2016-02-15 NOTE — Patient Instructions (Signed)
Good to see you  Ice is your friend I am hoping the injection buys [place] time.  We will refer you to Dr. Mayer Camel or [facility].  Either would be great  Continue the vitamins if you want  You know where I am if you have questions or concerns.

## 2016-02-15 NOTE — Progress Notes (Signed)
Pre visit review using our clinic review tool, if applicable. No additional management support is needed unless otherwise documented below in the visit note. 

## 2016-02-15 NOTE — Assessment & Plan Note (Signed)
Discussed with patient at great length. Patient can try an injection in the hip. Did tolerate the procedure well with some resolution of pain. Continued to have an antalgic gait. We discussed icing regimen. We discussed which activities to do in which ones to avoid. Patient will continue with the vitamin supplementation. Patient declined a refill of the tramadol. Patient encouraged in discussed with a surgeon and have this scheduled in the near future but we can repeat injection every 3 months if necessary.  Spent  25 minutes with patient face-to-face and had greater than 50% of counseling including as described above in assessment and plan.

## 2016-02-15 NOTE — Progress Notes (Signed)
[name] [place], [place] [address] Phone: [phone] Subjective:    I'm seeing this patient by the request  of:  Rica Mast, MD   CC: Left hip pain follow-up  [id number] [name] [name] is a 65 y.o. female coming in with complaint of left hip pain. Patient was seen previously and was diagnosed with severe osteoarthritic changes of the hip. There is a concern for patient having avascular necrosis. Patient also has advanced arthritis of the contralateral hip. Patient elected try conservative therapy including home exercises, icing, over-the-counter medications. Continued have pain and was given tramadol. Patient states that the pain is worsening. None of the modalities at this time seems to be making any significant difference. Patient states that the pain is affecting daily activities and can even wake her up at night.      Past Medical History  Diagnosis Date  . Alopecia     Followed by Dr. Kellie Moor, s/p kenalog injection   Past Surgical History  Procedure Laterality Date  . Novasure ablation  2011    Dr. Rayford Halsted  . Ligation / division saphenous vein    . Breast surgery  2004    right breast biopsy, benign  . Breast biopsy Right 2004    benign   Social History   Social History  . Marital Status: Married    Spouse Name: N/A  . Number of Children: N/A  . Years of Education: N/A   Social History Main Topics  . Smoking status: Never Smoker   . Smokeless tobacco: Never Used  . Alcohol Use: Yes     Comment: glass of wine every month or two  . Drug Use: No  . Sexual Activity: Not Asked   Other Topics Concern  . None   Social History Narrative   Allergies  Allergen Reactions  . Codeine     Rash   . Penicillins     rash   Family History  Problem Relation Age of Onset  . Cancer Mother     lung ca, died in 70s  . Heart disease Father     Past medical history, social, surgical and family history  all reviewed in electronic medical record.  No pertanent information unless stated regarding to the chief complaint.   Review of Systems: No headache, visual changes, nausea, vomiting, diarrhea, constipation, dizziness, abdominal pain, skin rash, fevers, chills, night sweats, weight loss, swollen lymph nodes, body aches, joint swelling, muscle aches, chest pain, shortness of breath, mood changes.   Objective Blood pressure 126/76, pulse 74, weight 142 lb (64.411 kg).  General: No apparent distress alert and oriented x3 mood and affect normal, dressed appropriately.  HEENT: Pupils equal, extraocular movements intact  Respiratory: Patient's speak in full sentences and does not appear short of breath  Cardiovascular: No lower extremity edema, non tender, no erythema  Skin: Warm dry intact with no signs of infection or rash on extremities or on axial skeleton.  Abdomen: Soft nontender  Neuro: Cranial nerves II through XII are intact, neurovascularly intact in all extremities with 2+ DTRs and 2+ pulses.  Lymph: No lymphadenopathy of posterior or anterior cervical chain or axillae bilaterally.  Gait Severe antalgic gait which is new. MSK:  Non tender with full range of motion and good stability and symmetric strength and tone of shoulders, elbows, wrist, ,knee and ankles bilaterally.   [id number] ROM IR: 5 Deg with pain Anteriorly and posteriorly ER: 35 Deg, Flexion: 120 Deg,  Extension: 40 Deg, Abduction: 25 Deg, Adduction: 25 Deg forcing symptoms in range of motion Strength IR: 5/5, ER: 5/5, Flexion: 5/5, Extension: 5/5, Abduction: 4/5, Adduction: 5/5 Pelvic alignment unremarkable to inspection and palpation. Standing hip rotation and gait without trendelenburg sign / unsteadiness. Greater trochanter without tenderness to palpation. Positive Corky Sox on the left side tenderness over the piriformis Mild sacroiliac joint pain as well today which is new. Contralateral hip has decreased range of motion  but less pain.  Procedure: Real-time Ultrasound Guided Injection of left intra-articular hip Device: GE Logiq E  Ultrasound guided injection is preferred based studies that show increased duration, increased effect, greater accuracy, decreased procedural pain, increased response rate with ultrasound guided versus blind injection.  Verbal informed consent obtained.  Time-out conducted.  Noted no overlying erythema, induration, or other signs of local infection.  Skin prepped in a sterile fashion.  Local anesthesia: Topical Ethyl chloride.  With sterile technique and under real time ultrasound guidance:  Anterior capsule visualized, needle visualized going to the head neck junction at the anterior capsule. Pictures taken. Patient did have injection of 3 cc of 1% lidocaine, 3 cc of 0.5% Marcaine, and 1 cc of Kenalog 40 mg/dL. Completed without difficulty  Pain immediately resolved suggesting accurate placement of the medication.  Advised to call if fevers/chills, erythema, induration, drainage, or persistent bleeding.  Images permanently stored and available for review in the ultrasound unit.  Impression: Technically successful ultrasound guided injection.    Impression and Recommendations:

## 2016-03-15 ENCOUNTER — Encounter: Payer: Self-pay | Admitting: Internal Medicine

## 2016-03-15 DIAGNOSIS — M160 Bilateral primary osteoarthritis of hip: Secondary | ICD-10-CM | POA: Diagnosis not present

## 2016-03-15 DIAGNOSIS — Z1239 Encounter for other screening for malignant neoplasm of breast: Secondary | ICD-10-CM

## 2016-03-15 DIAGNOSIS — M25552 Pain in left hip: Secondary | ICD-10-CM | POA: Diagnosis not present

## 2016-03-15 DIAGNOSIS — M25551 Pain in right hip: Secondary | ICD-10-CM | POA: Diagnosis not present

## 2016-03-17 NOTE — Telephone Encounter (Signed)
Unable to schedule this appointment d/t no order in EPIC. I will forward this to Dr. Gilford Rile to enter the order

## 2016-03-17 NOTE — Telephone Encounter (Signed)
Order placed

## 2016-03-20 ENCOUNTER — Other Ambulatory Visit: Payer: Self-pay | Admitting: Internal Medicine

## 2016-03-20 DIAGNOSIS — R928 Other abnormal and inconclusive findings on diagnostic imaging of breast: Secondary | ICD-10-CM

## 2016-03-28 ENCOUNTER — Other Ambulatory Visit: Payer: Self-pay | Admitting: Internal Medicine

## 2016-03-30 ENCOUNTER — Encounter: Payer: Self-pay | Admitting: Internal Medicine

## 2016-04-17 ENCOUNTER — Ambulatory Visit
Admission: RE | Admit: 2016-04-17 | Discharge: 2016-04-17 | Disposition: A | Discharge status: Home / Self Care | Payer: 59 | Source: Ambulatory Visit | Attending: Internal Medicine | Admitting: Internal Medicine

## 2016-04-17 DIAGNOSIS — R920 Mammographic microcalcification found on diagnostic imaging of breast: Secondary | ICD-10-CM | POA: Diagnosis not present

## 2016-04-17 DIAGNOSIS — R928 Other abnormal and inconclusive findings on diagnostic imaging of breast: Secondary | ICD-10-CM

## 2016-05-08 ENCOUNTER — Ambulatory Visit (INDEPENDENT_AMBULATORY_CARE_PROVIDER_SITE_OTHER): Payer: 59 | Admitting: Internal Medicine

## 2016-05-08 ENCOUNTER — Encounter: Payer: Self-pay | Admitting: Internal Medicine

## 2016-05-08 VITALS — BP 122/68 | HR 66 | Ht 64.0 in | Wt 143.2 lb

## 2016-05-08 DIAGNOSIS — Z01818 Encounter for other preprocedural examination: Secondary | ICD-10-CM | POA: Diagnosis not present

## 2016-05-08 DIAGNOSIS — M1612 Unilateral primary osteoarthritis, left hip: Secondary | ICD-10-CM

## 2016-05-08 DIAGNOSIS — M1990 Unspecified osteoarthritis, unspecified site: Secondary | ICD-10-CM

## 2016-05-08 NOTE — Assessment & Plan Note (Addendum)
Scheduled for left hip replacement. Based on current criteria, she would be low risk for perioperative cardiac events. Recommend to proceed with surgery. Labs to be performed later this week. EKG today showed NSR with no acute abnormalities.

## 2016-05-08 NOTE — Progress Notes (Signed)
Pre visit review using our clinic review tool, if applicable. No additional management support is needed unless otherwise documented below in the visit note. 

## 2016-05-08 NOTE — Patient Instructions (Signed)
Follow up in 3 months

## 2016-05-08 NOTE — Progress Notes (Signed)
Subjective:    Patient ID: [name] [name], female    DOB: Jun 01, 1951, 65 y.o.   MRN: [id number]  HPI  65YO female presents for follow up and surgical clearance.  Scheduled for hip replacement at [facility]. Left side in July and right side in August. Taking Meloxicam qod and Tylenol prn for pain. No previous issues with anesthesia.  Some history of menorrhagia, however no bleeding with prior surgeries. No recent illnesses.  Wt Readings from Last 3 Encounters:  05/08/16 143 lb 3.2 oz (64.955 kg)  02/15/16 142 lb (64.411 kg)  01/18/16 141 lb (63.957 kg)   BP Readings from Last 3 Encounters:  05/08/16 122/68  02/15/16 126/76  01/18/16 118/76    Past Medical History  Diagnosis Date  . Alopecia     Followed by Dr. Kellie Moor, s/p kenalog injection   Family History  Problem Relation Age of Onset  . Cancer Mother     lung ca, died in 45s  . Heart disease Father    Past Surgical History  Procedure Laterality Date  . Novasure ablation  2011    Dr. Rayford Halsted  . Ligation / division saphenous vein    . Breast surgery  2004    right breast biopsy, benign  . Breast biopsy Right 2004    benign   Social History   Social History  . Marital Status: Married    Spouse Name: N/A  . Number of Children: N/A  . Years of Education: N/A   Social History Main Topics  . Smoking status: Never Smoker   . Smokeless tobacco: Never Used  . Alcohol Use: 0.0 oz/week    0 Standard drinks or equivalent per week     Comment: glass of wine every month or two  . Drug Use: No  . Sexual Activity:    Partners: Male   Other Topics Concern  . None   Social History Narrative    Review of Systems  Constitutional: Negative for fever, chills, appetite change, fatigue and unexpected weight change.  HENT: Negative for congestion, postnasal drip, rhinorrhea, sinus pressure, sneezing, sore throat, trouble swallowing and voice change.   Eyes: Negative for visual disturbance.  Respiratory:  Negative for shortness of breath.   Cardiovascular: Negative for chest pain and leg swelling.  Gastrointestinal: Negative for nausea, vomiting, abdominal pain, diarrhea and constipation.  Musculoskeletal: Positive for arthralgias. Negative for myalgias, back pain, joint swelling and gait problem.  Skin: Negative for color change and rash.  Hematological: Negative for adenopathy. Does not bruise/bleed easily.  Psychiatric/Behavioral: Negative for sleep disturbance and dysphoric mood. The patient is not nervous/anxious.        Objective:    BP 122/68 mmHg  Pulse 66  Ht 5\' 4"  (1.626 m)  Wt 143 lb 3.2 oz (64.955 kg)  BMI 24.57 kg/m2  SpO2 99% Physical Exam  Constitutional: She is oriented to person, place, and time. She appears well-developed and well-nourished. No distress.  HENT:  Head: Normocephalic and atraumatic.  Right Ear: External ear normal.  Left Ear: External ear normal.  Nose: Nose normal.  Mouth/Throat: Oropharynx is clear and moist. No oropharyngeal exudate.  Eyes: Conjunctivae are normal. Pupils are equal, round, and reactive to light. Right eye exhibits no discharge. Left eye exhibits no discharge. No scleral icterus.  Neck: Normal range of motion. Neck supple. No tracheal deviation present. No thyromegaly present.  Cardiovascular: Normal rate, regular rhythm, normal heart sounds and intact distal pulses.  Exam reveals no gallop and  no friction rub.   No murmur heard. Pulmonary/Chest: Effort normal and breath sounds normal. No respiratory distress. She has no wheezes. She has no rales. She exhibits no tenderness.  Musculoskeletal: Normal range of motion. She exhibits no edema or tenderness.  Lymphadenopathy:    She has no cervical adenopathy.  Neurological: She is alert and oriented to person, place, and time. No cranial nerve deficit. She exhibits normal muscle tone. Coordination normal.  Skin: Skin is warm and dry. No rash noted. She is not diaphoretic. No erythema.  No pallor.  Psychiatric: She has a normal mood and affect. Her behavior is normal. Judgment and thought content normal.          Assessment & Plan:   Problem List Items Addressed This Visit      Unprioritized   Arthritis of left hip - Primary    Scheduled for left hip replacement. Based on current criteria, she would be low risk for perioperative cardiac events. Recommend to proceed with surgery. Labs to be performed later this week. EKG today showed NSR with no acute abnormalities.      Preoperative clearance   Relevant Orders   EKG 12-Lead (Completed)       Return in about 3 months (around 08/08/2016) for New Patient.  Ronette Deter, MD [facility]

## 2016-05-09 NOTE — Patient Instructions (Signed)
[name] [name]  05/09/2016   Your procedure is scheduled on: 05/23/2016    Report to [facility] Main  Entrance take [place]  elevators to 3rd floor to  [place] at   0830 AM.  Call this number if you have problems the morning of surgery [phone]   Remember: ONLY 1 PERSON MAY GO WITH YOU TO SHORT STAY TO GET  READY MORNING OF [place].  Do not eat food or drink liquids :After Midnight.     Take these medicines the morning of surgery with A SIP OF WATER: none                                 You may not have any metal on your body including hair pins and              piercings  Do not wear jewelry, make-up, lotions, powders or perfumes, deodorant             Do not wear nail polish.  Do not shave  48 hours prior to surgery.              Do not bring valuables to the hospital. [place].  Contacts, dentures or bridgework may not be worn into surgery.  Leave suitcase in the car. After surgery it may be brought to your room.        Special Instructions: coughing and deep breathing exercises, leg exercises               Please read over the following fact sheets you were given: _____________________________________________________________________             [facility] - Preparing for Surgery Before surgery, you can play an important role.  Because skin is not sterile, your skin needs to be as free of germs as possible.  You can reduce the number of germs on your skin by washing with CHG (chlorahexidine gluconate) soap before surgery.  CHG is an antiseptic cleaner which kills germs and bonds with the skin to continue killing germs even after washing. Please DO NOT use if you have an allergy to CHG or antibacterial soaps.  If your skin becomes reddened/irritated stop using the CHG and inform your nurse when you arrive at Short Stay. Do not shave (including legs and underarms) for at least 48  hours prior to the first CHG shower.  You may shave your face/neck. Please follow these instructions carefully:  1.  Shower with CHG Soap the night before surgery and the  morning of Surgery.  2.  If you choose to wash your hair, wash your hair first as usual with your  normal  shampoo.  3.  After you shampoo, rinse your hair and body thoroughly to remove the  shampoo.                           4.  Use CHG as you would any other liquid soap.  You can apply chg directly  to the skin and wash                       Gently with a  scrungie or clean washcloth.  5.  Apply the CHG Soap to your body ONLY FROM THE NECK DOWN.   Do not use on face/ open                           Wound or open sores. Avoid contact with eyes, ears mouth and genitals (private parts).                       Wash face,  Genitals (private parts) with your normal soap.             6.  Wash thoroughly, paying special attention to the area where your surgery  will be performed.  7.  Thoroughly rinse your body with warm water from the neck down.  8.  DO NOT shower/wash with your normal soap after using and rinsing off  the CHG Soap.                9.  Pat yourself dry with a clean towel.            10.  Wear clean pajamas.            11.  Place clean sheets on your bed the night of your first shower and do not  sleep with pets. Day of Surgery : Do not apply any lotions/deodorants the morning of surgery.  Please wear clean clothes to the hospital/surgery center.  FAILURE TO FOLLOW THESE INSTRUCTIONS MAY RESULT IN THE CANCELLATION OF YOUR SURGERY PATIENT SIGNATURE_________________________________  NURSE SIGNATURE__________________________________  ________________________________________________________________________  WHAT IS A BLOOD TRANSFUSION? Blood Transfusion Information  A transfusion is the replacement of blood or some of its parts. Blood is made up of multiple cells which provide different functions.  Red blood cells  carry oxygen and are used for blood loss replacement.  White blood cells fight against infection.  Platelets control bleeding.  Plasma helps clot blood.  Other blood products are available for specialized needs, such as hemophilia or other clotting disorders. BEFORE THE TRANSFUSION  Who gives blood for transfusions?   Healthy volunteers who are fully evaluated to make sure their blood is safe. This is blood bank blood. Transfusion therapy is the safest it has ever been in the practice of medicine. Before blood is taken from a donor, a complete history is taken to make sure that person has no history of diseases nor engages in risky social behavior (examples are intravenous drug use or sexual activity with multiple partners). The donor's travel history is screened to minimize risk of transmitting infections, such as malaria. The donated blood is tested for signs of infectious diseases, such as HIV and hepatitis. The blood is then tested to be sure it is compatible with you in order to minimize the chance of a transfusion reaction. If you or a relative donates blood, this is often done in anticipation of surgery and is not appropriate for emergency situations. It takes many days to process the donated blood. RISKS AND COMPLICATIONS Although transfusion therapy is very safe and saves many lives, the main dangers of transfusion include:  1. Getting an infectious disease. 2. Developing a transfusion reaction. This is an allergic reaction to something in the blood you were given. Every precaution is taken to prevent this. The decision to have a blood transfusion has been considered carefully by your caregiver before blood is given. Blood is not given unless the benefits outweigh the risks. AFTER  THE TRANSFUSION  Right after receiving a blood transfusion, you will usually feel much better and more energetic. This is especially true if your red blood cells have gotten low (anemic). The transfusion  raises the level of the red blood cells which carry oxygen, and this usually causes an energy increase.  The nurse administering the transfusion will monitor you carefully for complications. HOME CARE INSTRUCTIONS  No special instructions are needed after a transfusion. You may find your energy is better. Speak with your caregiver about any limitations on activity for underlying diseases you may have. SEEK MEDICAL CARE IF:   Your condition is not improving after your transfusion.  You develop redness or irritation at the intravenous (IV) site. SEEK IMMEDIATE MEDICAL CARE IF:  Any of the following symptoms occur over the next 12 hours:  Shaking chills.  You have a temperature by mouth above 102 F (38.9 C), not controlled by medicine.  Chest, back, or muscle pain.  People around you feel you are not acting correctly or are confused.  Shortness of breath or difficulty breathing.  Dizziness and fainting.  You get a rash or develop hives.  You have a decrease in urine output.  Your urine turns a dark color or changes to pink, red, or brown. Any of the following symptoms occur over the next 10 days:  You have a temperature by mouth above 102 F (38.9 C), not controlled by medicine.  Shortness of breath.  Weakness after normal activity.  The white part of the eye turns yellow (jaundice).  You have a decrease in the amount of urine or are urinating less often.  Your urine turns a dark color or changes to pink, red, or brown. Document Released: 10/27/2000 Document Revised: 01/22/2012 Document Reviewed: 06/15/2008 ExitCare Patient Information 2014 [place].  _______________________________________________________________________  Incentive Spirometer  An incentive spirometer is a tool that can help keep your lungs clear and active. This tool measures how well you are filling your lungs with each breath. Taking long deep breaths may help reverse or decrease the chance  of developing breathing (pulmonary) problems (especially infection) following:  A long period of time when you are unable to move or be active. BEFORE THE PROCEDURE   If the spirometer includes an indicator to show your best effort, your nurse or respiratory therapist will set it to a desired goal.  If possible, sit up straight or lean slightly forward. Try not to slouch.  Hold the incentive spirometer in an upright position. INSTRUCTIONS FOR USE  3. Sit on the edge of your bed if possible, or sit up as far as you can in bed or on a chair. 4. Hold the incentive spirometer in an upright position. 5. Breathe out normally. 6. Place the mouthpiece in your mouth and seal your lips tightly around it. 7. Breathe in slowly and as deeply as possible, raising the piston or the ball toward the top of the column. 8. Hold your breath for 3-5 seconds or for as long as possible. Allow the piston or ball to fall to the bottom of the column. 9. Remove the mouthpiece from your mouth and breathe out normally. 10. Rest for a few seconds and repeat Steps 1 through 7 at least 10 times every 1-2 hours when you are awake. Take your time and take a few normal breaths between deep breaths. 11. The spirometer may include an indicator to show your best effort. Use the indicator as a goal to work toward during each  repetition. 12. After each set of 10 deep breaths, practice coughing to be sure your lungs are clear. If you have an incision (the cut made at the time of surgery), support your incision when coughing by placing a pillow or rolled up towels firmly against it. Once you are able to get out of bed, walk around indoors and cough well. You may stop using the incentive spirometer when instructed by your caregiver.  RISKS AND COMPLICATIONS  Take your time so you do not get dizzy or light-headed.  If you are in pain, you may need to take or ask for pain medication before doing incentive spirometry. It is harder to  take a deep breath if you are having pain. AFTER USE  Rest and breathe slowly and easily.  It can be helpful to keep track of a log of your progress. Your caregiver can provide you with a simple table to help with this. If you are using the spirometer at home, follow these instructions: [place] IF:   You are having difficultly using the spirometer.  You have trouble using the spirometer as often as instructed.  Your pain medication is not giving enough relief while using the spirometer.  You develop fever of 100.5 F (38.1 C) or higher. SEEK IMMEDIATE MEDICAL CARE IF:   You cough up bloody sputum that had not been present before.  You develop fever of 102 F (38.9 C) or greater.  You develop worsening pain at or near the incision site. MAKE SURE YOU:   Understand these instructions.  Will watch your condition.  Will get help right away if you are not doing well or get worse. Document Released: 03/12/2007 Document Revised: 01/22/2012 Document Reviewed: 05/13/2007 [facility] Patient Information 2014 ExitCare, [place].   ________________________________________________________________________    ________________________________________________________________________

## 2016-05-10 ENCOUNTER — Encounter (HOSPITAL_COMMUNITY)
Admission: RE | Admit: 2016-05-10 | Discharge: 2016-05-10 | Disposition: A | Discharge status: Home / Self Care | Payer: 59 | Source: Ambulatory Visit | Attending: Orthopedic Surgery | Admitting: Orthopedic Surgery

## 2016-05-10 ENCOUNTER — Encounter (HOSPITAL_COMMUNITY): Payer: Self-pay

## 2016-05-10 DIAGNOSIS — M1612 Unilateral primary osteoarthritis, left hip: Secondary | ICD-10-CM | POA: Diagnosis not present

## 2016-05-10 DIAGNOSIS — Z01812 Encounter for preprocedural laboratory examination: Secondary | ICD-10-CM | POA: Insufficient documentation

## 2016-05-10 HISTORY — DX: Unspecified osteoarthritis, unspecified site: M19.90

## 2016-05-10 HISTORY — DX: Unspecified displaced fracture of third cervical vertebra, initial encounter for closed fracture: S12.200A

## 2016-05-10 LAB — CBC
HCT: 41.5 % (ref 36.0–46.0)
HEMOGLOBIN: 14.1 g/dL (ref 12.0–15.0)
MCH: 30.7 pg (ref 26.0–34.0)
MCHC: 34 g/dL (ref 30.0–36.0)
MCV: 90.4 fL (ref 78.0–100.0)
Platelets: 246 10*3/uL (ref 150–400)
RBC: 4.59 MIL/uL (ref 3.87–5.11)
RDW: 12.4 % (ref 11.5–15.5)
WBC: 3.9 10*3/uL — ABNORMAL LOW (ref 4.0–10.5)

## 2016-05-10 LAB — SURGICAL PCR SCREEN
MRSA, PCR: NEGATIVE
Staphylococcus aureus: NEGATIVE
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
## 2016-05-10 NOTE — Progress Notes (Addendum)
EKG-05/08/16- EPIC  LOV-PCP-05/08/16- EPIC  Clearance 05/08/16- Dr Gilford Rile on chart

## 2016-05-23 ENCOUNTER — Inpatient Hospital Stay (HOSPITAL_COMMUNITY)
Admission: RE | Admit: 2016-05-23 | Discharge: 2016-05-24 | DRG: 470 | Disposition: A | Discharge status: Home Health | Payer: 59 | Source: Ambulatory Visit | Attending: Orthopedic Surgery | Admitting: Orthopedic Surgery

## 2016-05-23 ENCOUNTER — Inpatient Hospital Stay (HOSPITAL_COMMUNITY): Payer: 59

## 2016-05-23 ENCOUNTER — Encounter (HOSPITAL_COMMUNITY)
Admission: RE | Disposition: A | Discharge status: Home Health | Payer: Self-pay | Source: Ambulatory Visit | Attending: Orthopedic Surgery

## 2016-05-23 ENCOUNTER — Inpatient Hospital Stay (HOSPITAL_COMMUNITY): Payer: 59 | Admitting: Anesthesiology

## 2016-05-23 ENCOUNTER — Encounter (HOSPITAL_COMMUNITY): Payer: Self-pay | Admitting: *Deleted

## 2016-05-23 DIAGNOSIS — M25552 Pain in left hip: Secondary | ICD-10-CM

## 2016-05-23 DIAGNOSIS — M1612 Unilateral primary osteoarthritis, left hip: Principal | ICD-10-CM | POA: Diagnosis present

## 2016-05-23 DIAGNOSIS — S72092A Other fracture of head and neck of left femur, initial encounter for closed fracture: Secondary | ICD-10-CM | POA: Diagnosis not present

## 2016-05-23 DIAGNOSIS — Z96649 Presence of unspecified artificial hip joint: Secondary | ICD-10-CM

## 2016-05-23 HISTORY — PX: TOTAL HIP ARTHROPLASTY: SHX124

## 2016-05-23 LAB — TYPE AND SCREEN
ABO/RH(D): A POS
Antibody Screen: NEGATIVE

## 2016-05-23 LAB — ABO/RH: ABO/RH(D): A POS

## 2016-05-23 SURGERY — ARTHROPLASTY, HIP, TOTAL, ANTERIOR APPROACH
Anesthesia: Spinal | Site: Hip | Laterality: Left

## 2016-05-23 MED ORDER — ONDANSETRON HCL 4 MG/2ML IJ SOLN: 4.0000 mg | Freq: Four times a day (QID) | INTRAMUSCULAR | Status: DC | PRN

## 2016-05-23 MED ORDER — PROPOFOL 10 MG/ML IV BOLUS
INTRAVENOUS | Status: AC
  Filled 2016-05-23: qty 40

## 2016-05-23 MED ORDER — DOCUSATE SODIUM 100 MG PO CAPS
100.0000 mg | ORAL_CAPSULE | Freq: Two times a day (BID) | ORAL | Status: DC
  Administered 2016-05-23 – 2016-05-24 (×2): 100 mg via ORAL
  Filled 2016-05-23 (×2): qty 1

## 2016-05-23 MED ORDER — DEXAMETHASONE SODIUM PHOSPHATE 10 MG/ML IJ SOLN
10.0000 mg | Freq: Once | INTRAMUSCULAR | Status: AC
  Administered 2016-05-23: 10 mg via INTRAVENOUS

## 2016-05-23 MED ORDER — BUPIVACAINE HCL (PF) 0.5 % IJ SOLN
INTRAMUSCULAR | Status: AC
  Filled 2016-05-23: qty 30

## 2016-05-23 MED ORDER — MAGNESIUM CITRATE PO SOLN: 1.0000 | Freq: Once | ORAL | Status: DC | PRN

## 2016-05-23 MED ORDER — PROPOFOL 10 MG/ML IV BOLUS
INTRAVENOUS | Status: AC
  Filled 2016-05-23: qty 20

## 2016-05-23 MED ORDER — MIDAZOLAM HCL 2 MG/2ML IJ SOLN
INTRAMUSCULAR | Status: AC
  Filled 2016-05-23: qty 2

## 2016-05-23 MED ORDER — BISACODYL 10 MG RE SUPP: 10.0000 mg | Freq: Every day | RECTAL | Status: DC | PRN

## 2016-05-23 MED ORDER — CHLORHEXIDINE GLUCONATE 4 % EX LIQD: 60.0000 mL | Freq: Once | CUTANEOUS | Status: DC

## 2016-05-23 MED ORDER — PHENOL 1.4 % MT LIQD
1.0000 | OROMUCOSAL | Status: DC | PRN
  Filled 2016-05-23: qty 177

## 2016-05-23 MED ORDER — BUPIVACAINE HCL (PF) 0.5 % IJ SOLN
INTRAMUSCULAR | Status: DC | PRN
Start: 2016-05-23 — End: 2016-05-23
  Administered 2016-05-23: 15 mg via INTRATHECAL

## 2016-05-23 MED ORDER — METHOCARBAMOL 500 MG PO TABS
500.0000 mg | ORAL_TABLET | Freq: Four times a day (QID) | ORAL | Status: DC | PRN
  Administered 2016-05-23: 500 mg via ORAL
  Filled 2016-05-23: qty 1

## 2016-05-23 MED ORDER — LACTATED RINGERS IV SOLN
INTRAVENOUS | Status: DC
  Administered 2016-05-23 (×3): via INTRAVENOUS

## 2016-05-23 MED ORDER — ONDANSETRON HCL 4 MG/2ML IJ SOLN
INTRAMUSCULAR | Status: DC | PRN
  Administered 2016-05-23: 4 mg via INTRAVENOUS

## 2016-05-23 MED ORDER — METOCLOPRAMIDE HCL 5 MG PO TABS: 5.0000 mg | ORAL_TABLET | Freq: Three times a day (TID) | ORAL | Status: DC | PRN

## 2016-05-23 MED ORDER — MENTHOL 3 MG MT LOZG: 1.0000 | LOZENGE | OROMUCOSAL | Status: DC | PRN

## 2016-05-23 MED ORDER — ACETAMINOPHEN 325 MG PO TABS: 325.0000 mg | ORAL_TABLET | ORAL | Status: DC | PRN

## 2016-05-23 MED ORDER — MIDAZOLAM HCL 5 MG/5ML IJ SOLN
INTRAMUSCULAR | Status: DC | PRN
  Administered 2016-05-23: 2 mg via INTRAVENOUS

## 2016-05-23 MED ORDER — VANCOMYCIN HCL IN DEXTROSE 1-5 GM/200ML-% IV SOLN
1000.0000 mg | Freq: Two times a day (BID) | INTRAVENOUS | Status: AC
Start: 2016-05-23 — End: 2016-05-23
  Administered 2016-05-23: 1000 mg via INTRAVENOUS
  Filled 2016-05-23: qty 200

## 2016-05-23 MED ORDER — SODIUM CHLORIDE 0.9 % IR SOLN
Status: DC | PRN
  Administered 2016-05-23: 1000 mL

## 2016-05-23 MED ORDER — DEXTROSE 5 % IV SOLN
500.0000 mg | Freq: Four times a day (QID) | INTRAVENOUS | Status: DC | PRN
  Administered 2016-05-23: 500 mg via INTRAVENOUS
  Filled 2016-05-23: qty 550
  Filled 2016-05-23: qty 5

## 2016-05-23 MED ORDER — ONDANSETRON HCL 4 MG/2ML IJ SOLN
INTRAMUSCULAR | Status: AC
  Filled 2016-05-23: qty 2

## 2016-05-23 MED ORDER — DIPHENHYDRAMINE HCL 25 MG PO CAPS: 25.0000 mg | ORAL_CAPSULE | Freq: Four times a day (QID) | ORAL | Status: DC | PRN

## 2016-05-23 MED ORDER — FENTANYL CITRATE (PF) 100 MCG/2ML IJ SOLN
INTRAMUSCULAR | Status: AC
  Filled 2016-05-23: qty 2

## 2016-05-23 MED ORDER — POLYETHYLENE GLYCOL 3350 17 G PO PACK
17.0000 g | PACK | Freq: Two times a day (BID) | ORAL | Status: DC
  Administered 2016-05-23 – 2016-05-24 (×2): 17 g via ORAL
  Filled 2016-05-23 (×2): qty 1

## 2016-05-23 MED ORDER — DEXAMETHASONE SODIUM PHOSPHATE 10 MG/ML IJ SOLN
10.0000 mg | Freq: Once | INTRAMUSCULAR | Status: AC
  Administered 2016-05-24: 10 mg via INTRAVENOUS
  Filled 2016-05-23: qty 1

## 2016-05-23 MED ORDER — HYDROMORPHONE HCL 1 MG/ML IJ SOLN: 0.5000 mg | INTRAMUSCULAR | Status: DC | PRN

## 2016-05-23 MED ORDER — ACETAMINOPHEN 160 MG/5ML PO SOLN: 325.0000 mg | ORAL | Status: DC | PRN

## 2016-05-23 MED ORDER — VANCOMYCIN HCL IN DEXTROSE 1-5 GM/200ML-% IV SOLN
1000.0000 mg | INTRAVENOUS | Status: AC
  Administered 2016-05-23: 1000 mg via INTRAVENOUS
  Filled 2016-05-23: qty 200

## 2016-05-23 MED ORDER — METOCLOPRAMIDE HCL 5 MG/ML IJ SOLN: 5.0000 mg | Freq: Three times a day (TID) | INTRAMUSCULAR | Status: DC | PRN

## 2016-05-23 MED ORDER — ASPIRIN EC 325 MG PO TBEC
325.0000 mg | DELAYED_RELEASE_TABLET | Freq: Two times a day (BID) | ORAL | Status: DC
  Administered 2016-05-24: 325 mg via ORAL
  Filled 2016-05-23: qty 1

## 2016-05-23 MED ORDER — PROPOFOL 500 MG/50ML IV EMUL
INTRAVENOUS | Status: DC | PRN
  Administered 2016-05-23: 35 ug/kg/min via INTRAVENOUS

## 2016-05-23 MED ORDER — TRANEXAMIC ACID 1000 MG/10ML IV SOLN
1000.0000 mg | INTRAVENOUS | Status: AC
  Administered 2016-05-23: 1000 mg via INTRAVENOUS
  Filled 2016-05-23: qty 10

## 2016-05-23 MED ORDER — HYDROMORPHONE HCL 1 MG/ML IJ SOLN
0.2500 mg | INTRAMUSCULAR | Status: DC | PRN
  Administered 2016-05-23 (×2): 0.5 mg via INTRAVENOUS

## 2016-05-23 MED ORDER — ALUM & MAG HYDROXIDE-SIMETH 200-200-20 MG/5ML PO SUSP: 30.0000 mL | ORAL | Status: DC | PRN

## 2016-05-23 MED ORDER — SODIUM CHLORIDE 0.9 % IV SOLN
100.0000 mL/h | INTRAVENOUS | Status: DC
  Administered 2016-05-23 – 2016-05-24 (×2): 100 mL/h via INTRAVENOUS
  Filled 2016-05-23 (×4): qty 1000

## 2016-05-23 MED ORDER — CELECOXIB 200 MG PO CAPS
200.0000 mg | ORAL_CAPSULE | Freq: Two times a day (BID) | ORAL | Status: DC
  Administered 2016-05-23 – 2016-05-24 (×2): 200 mg via ORAL
  Filled 2016-05-23 (×2): qty 1

## 2016-05-23 MED ORDER — TRANEXAMIC ACID 1000 MG/10ML IV SOLN
1000.0000 mg | Freq: Once | INTRAVENOUS | Status: AC
  Administered 2016-05-23: 1000 mg via INTRAVENOUS
  Filled 2016-05-23: qty 10

## 2016-05-23 MED ORDER — FENTANYL CITRATE (PF) 100 MCG/2ML IJ SOLN
INTRAMUSCULAR | Status: DC | PRN
  Administered 2016-05-23 (×2): 50 ug via INTRAVENOUS

## 2016-05-23 MED ORDER — DEXAMETHASONE SODIUM PHOSPHATE 10 MG/ML IJ SOLN
INTRAMUSCULAR | Status: AC
  Filled 2016-05-23: qty 1

## 2016-05-23 MED ORDER — HYDROMORPHONE HCL 1 MG/ML IJ SOLN
INTRAMUSCULAR | Status: AC
  Filled 2016-05-23: qty 1

## 2016-05-23 MED ORDER — ONDANSETRON HCL 4 MG PO TABS: 4.0000 mg | ORAL_TABLET | Freq: Four times a day (QID) | ORAL | Status: DC | PRN

## 2016-05-23 MED ORDER — HYDROCODONE-ACETAMINOPHEN 7.5-325 MG PO TABS
1.0000 | ORAL_TABLET | ORAL | Status: DC
  Administered 2016-05-23: 2 via ORAL
  Administered 2016-05-23: 1 via ORAL
  Administered 2016-05-24: 2 via ORAL
  Administered 2016-05-24: 1 via ORAL
  Administered 2016-05-24 (×2): 2 via ORAL
  Filled 2016-05-23: qty 2
  Filled 2016-05-23: qty 1
  Filled 2016-05-23 (×4): qty 2

## 2016-05-23 MED ORDER — FERROUS SULFATE 325 (65 FE) MG PO TABS
325.0000 mg | ORAL_TABLET | Freq: Three times a day (TID) | ORAL | Status: DC
Start: 2016-05-23 — End: 2016-05-24
  Administered 2016-05-24 (×2): 325 mg via ORAL
  Filled 2016-05-23 (×2): qty 1

## 2016-05-23 MED ORDER — OXYCODONE HCL 5 MG PO TABS: 5.0000 mg | ORAL_TABLET | Freq: Once | ORAL | Status: DC | PRN

## 2016-05-23 MED ORDER — OXYCODONE HCL 5 MG/5ML PO SOLN
5.0000 mg | Freq: Once | ORAL | Status: DC | PRN
  Filled 2016-05-23: qty 5

## 2016-05-23 SURGICAL SUPPLY — 36 items
BAG DECANTER FOR FLEXI CONT (MISCELLANEOUS) IMPLANT
BAG ZIPLOCK 12X15 (MISCELLANEOUS) IMPLANT
CAPT HIP TOTAL 2 ×3 IMPLANT
CLOTH BEACON ORANGE TIMEOUT ST (SAFETY) ×3 IMPLANT
COVER PERINEAL POST (MISCELLANEOUS) ×3 IMPLANT
DRAPE STERI IOBAN 125X83 (DRAPES) ×3 IMPLANT
DRAPE U-SHAPE 47X51 STRL (DRAPES) ×6 IMPLANT
DRESSING AQUACEL AG SP 3.5X10 (GAUZE/BANDAGES/DRESSINGS) ×1 IMPLANT
DRSG AQUACEL AG ADV 3.5X10 (GAUZE/BANDAGES/DRESSINGS) ×3 IMPLANT
DRSG AQUACEL AG SP 3.5X10 (GAUZE/BANDAGES/DRESSINGS) ×3
DURAPREP 26ML APPLICATOR (WOUND CARE) ×3 IMPLANT
ELECT REM PT RETURN 15FT ADLT (MISCELLANEOUS) IMPLANT
ELECT REM PT RETURN 9FT ADLT (ELECTROSURGICAL) ×3
ELECTRODE REM PT RTRN 9FT ADLT (ELECTROSURGICAL) ×1 IMPLANT
GLOVE BIOGEL M STRL SZ7.5 (GLOVE) ×6 IMPLANT
GLOVE BIOGEL PI IND STRL 7.5 (GLOVE) ×2 IMPLANT
GLOVE BIOGEL PI IND STRL 8.5 (GLOVE) IMPLANT
GLOVE BIOGEL PI INDICATOR 7.5 (GLOVE) ×4
GLOVE BIOGEL PI INDICATOR 8.5 (GLOVE)
GLOVE ECLIPSE 8.0 STRL XLNG CF (GLOVE) IMPLANT
GLOVE ORTHO TXT STRL SZ7.5 (GLOVE) ×3 IMPLANT
GOWN STRL REUS W/TWL LRG LVL3 (GOWN DISPOSABLE) ×3 IMPLANT
GOWN STRL REUS W/TWL XL LVL3 (GOWN DISPOSABLE) ×3 IMPLANT
HOLDER FOLEY CATH W/STRAP (MISCELLANEOUS) ×3 IMPLANT
LIQUID BAND (GAUZE/BANDAGES/DRESSINGS) ×3 IMPLANT
PACK ANTERIOR HIP CUSTOM (KITS) ×3 IMPLANT
SAW OSC TIP CART 19.5X105X1.3 (SAW) ×3 IMPLANT
SUT MNCRL AB 4-0 PS2 18 (SUTURE) ×3 IMPLANT
SUT VIC AB 1 CT1 36 (SUTURE) ×9 IMPLANT
SUT VIC AB 2-0 CT1 27 (SUTURE) ×4
SUT VIC AB 2-0 CT1 TAPERPNT 27 (SUTURE) ×2 IMPLANT
SUT VLOC 180 0 24IN GS25 (SUTURE) ×3 IMPLANT
TRAY FOLEY W/METER SILVER 14FR (SET/KITS/TRAYS/PACK) ×3 IMPLANT
TRAY FOLEY W/METER SILVER 16FR (SET/KITS/TRAYS/PACK) IMPLANT
WATER STERILE IRR 1500ML POUR (IV SOLUTION) ×3 IMPLANT
YANKAUER SUCT BULB TIP 10FT TU (MISCELLANEOUS) ×3 IMPLANT

## 2016-05-23 NOTE — Anesthesia Preprocedure Evaluation (Signed)
Anesthesia Evaluation  Patient identified by MRN, date of birth, ID band Patient awake    Reviewed: Allergy & Precautions, NPO status , Patient's Chart, lab work & pertinent test results  History of Anesthesia Complications Negative for: history of anesthetic complications  Airway Mallampati: II  TM Distance: >3 FB Neck ROM: Full    Dental  (+) Teeth Intact   Pulmonary neg pulmonary ROS,    breath sounds clear to auscultation       Cardiovascular negative cardio ROS   Rhythm:Regular     Neuro/Psych Previous cervical fracture s/p repair without neurologic deficits   Neuromuscular disease negative psych ROS   GI/Hepatic negative GI ROS, Neg liver ROS,   Endo/Other  negative endocrine ROS  Renal/GU negative Renal ROS     Musculoskeletal  (+) Arthritis ,   Abdominal   Peds  Hematology negative hematology ROS (+)   Anesthesia Other Findings   Reproductive/Obstetrics                             Anesthesia Physical Anesthesia Plan  ASA: I  Anesthesia Plan: Spinal   Post-op Pain Management:    Induction:   Airway Management Planned: Natural Airway, Nasal Cannula and Simple Face Mask  Additional Equipment: None  Intra-op Plan:   Post-operative Plan:   Informed Consent: I have reviewed the patients History and Physical, chart, labs and discussed the procedure including the risks, benefits and alternatives for the proposed anesthesia with the patient or authorized representative who has indicated his/her understanding and acceptance.   Dental advisory given  Plan Discussed with: CRNA and Surgeon  Anesthesia Plan Comments:         Anesthesia Quick Evaluation

## 2016-05-23 NOTE — Progress Notes (Signed)
Dr Ermalene Postin at bedside. Aware Spinal Block wearing off slowly.  States pt may go to room at this time.

## 2016-05-23 NOTE — Op Note (Signed)
NAME:  [name] [name] NO.: [id number]      MEDICAL RECORD NO.: [id number]      FACILITY:  [facility]      PHYSICIAN:  Paralee Cancel D  DATE OF BIRTH:  1950-12-14     DATE OF PROCEDURE:  05/23/2016                                 OPERATIVE REPORT         PREOPERATIVE DIAGNOSIS: Left  hip osteoarthritis.      POSTOPERATIVE DIAGNOSIS:  Left hip osteoarthritis.      PROCEDURE:  Left total hip replacement through an anterior approach   utilizing DePuy THR system, component size 11mm pinnacle cup, a size 32+4 neutral   Altrex liner, a size 2 Hi Tri Lock stem with a 32+5 delta ceramic   ball.      SURGEON:  Pietro Cassis. Alvan Dame, M.D.      ASSISTANT:  Nehemiah Massed, PA-C     ANESTHESIA:  Spinal.      SPECIMENS:  None.      COMPLICATIONS:  None.      BLOOD LOSS:  600 cc     DRAINS:  None.      INDICATION OF THE PROCEDURE:  [name] [name] is a 65 y.o. female who had   presented to office for evaluation of left hip pain.  Radiographs revealed   progressive degenerative changes with bone-on-bone   articulation to the  hip joint.  The patient had painful limited range of   motion significantly affecting their overall quality of life.  The patient was failing to    respond to conservative measures, and at this point was ready   to proceed with more definitive measures.  The patient has noted progressive   degenerative changes in his hip, progressive problems and dysfunction   with regarding the hip prior to surgery.  Consent was obtained for   benefit of pain relief.  Specific risk of infection, DVT, component   failure, dislocation, need for revision surgery, as well discussion of   the anterior versus posterior approach were reviewed.  Consent was   obtained for benefit of anterior pain relief through an anterior   approach.      PROCEDURE IN DETAIL:  The patient was brought to operative theater.   Once adequate anesthesia, preoperative  antibiotics, 1gm of Vancomycin, 1 gm of Tranexamic Acid, and 10 mg of Decadron administered.   The patient was positioned supine on the OSI Hanna table.  Once adequate   padding of boney process was carried out, we had predraped out the hip, and  used fluoroscopy to confirm orientation of the pelvis and position.      The left hip was then prepped and draped from proximal iliac crest to   mid thigh with shower curtain technique.      Time-out was performed identifying the patient, planned procedure, and   extremity.     An incision was then made 2 cm distal and lateral to the   anterior superior iliac spine extending over the orientation of the   tensor fascia lata muscle and sharp dissection was carried down to the   fascia of the muscle and protractor placed in the soft tissues.      The fascia was  then incised.  The muscle belly was identified and swept   laterally and retractor placed along the superior neck.  Following   cauterization of the circumflex vessels and removing some pericapsular   fat, a second cobra retractor was placed on the inferior neck.  A third   retractor was placed on the anterior acetabulum after elevating the   anterior rectus.  A L-capsulotomy was along the line of the   superior neck to the trochanteric fossa, then extended proximally and   distally.  Tag sutures were placed and the retractors were then placed   intracapsular.  We then identified the trochanteric fossa and   orientation of my neck cut, confirmed this radiographically   and then made a neck osteotomy with the femur on traction.  The femoral   head was removed without difficulty or complication.  Traction was let   off and retractors were placed posterior and anterior around the   acetabulum.      The labrum and foveal tissue were debrided.  I began reaming with a 16mm   reamer and reamed up to 69mm reamer with good bony bed preparation and a 41mm   cup was chosen.  The final 69mm Pinnacle  cup was then impacted under fluoroscopy  to confirm the depth of penetration and orientation with respect to   abduction.  A screw was placed followed by the hole eliminator.  The final   32+4 neutral Altrex liner was impacted with good visualized rim fit.  The cup was positioned anatomically within the acetabular portion of the pelvis.      At this point, the femur was rolled at 80 degrees.  Further capsule was   released off the inferior aspect of the femoral neck.  I then   released the superior capsule proximally.  The hook was placed laterally   along the femur and elevated manually and held in position with the bed   hook.  The leg was then extended and adducted with the leg rolled to 100   degrees of external rotation.  Once the proximal femur was fully   exposed, I used a box osteotome to set orientation.  I then began   broaching with the starting chili pepper broach and passed this by hand and then broached up to 2.  With the 2 broach in place I chose a high offset neck and did several trial reductions.  The offset was appropriate, leg lengths   appeared to be equal best matched with the +5 head ball, confirmed radiographically.   Given these findings, I went ahead and dislocated the hip, repositioned all   retractors and positioned the right hip in the extended and abducted position.  The final 2 Hi Tri Lock stem was   chosen and it was impacted down to the level of neck cut.  Based on this   and the trial reduction, a 32+5 delta ceramic ball was chosen and   impacted onto a clean and dry trunnion, and the hip was reduced.  The   hip had been irrigated throughout the case again at this point.  I did   reapproximate the superior capsular leaflet to the anterior leaflet   using #1 Vicryl.  The fascia of the   tensor fascia lata muscle was then reapproximated using #1 Vicryl and #0 V-lock sutures.  The   remaining wound was closed with 2-0 Vicryl and running 4-0 Monocryl.   The hip  was cleaned, dried, and  dressed sterilely using Dermabond and   Aquacel dressing.  She was then brought   to recovery room in stable condition tolerating the procedure well.    Nehemiah Massed, PA-C was present for the entirety of the case involved from   preoperative positioning, perioperative retractor management, general   facilitation of the case, as well as primary wound closure as assistant.            Pietro Cassis Alvan Dame, M.D.        05/23/2016 2:03 PM

## 2016-05-23 NOTE — Transfer of Care (Signed)
Immediate Anesthesia Transfer of Care Note  Patient: [name] [name]  Procedure(s) Performed: Procedure(s): LEFT TOTAL HIP ARTHROPLASTY ANTERIOR APPROACH (Left)  Patient Location: PACU  Anesthesia Type:Spinal  [name]: sedated  Airway & Oxygen Therapy: Patient Spontanous Breathing and Patient connected to face mask oxygen  Post-op Assessment: Report given to RN and Post -op Vital signs reviewed and stable  Post vital signs: Reviewed and stable  Last Vitals:  Filed Vitals:   05/23/16 0841  BP: 139/71  Pulse: 70  Temp: 36.6 C  Resp: 16    Last Pain:  Filed Vitals:   05/23/16 0914  PainSc: 2       Patients Stated Pain Goal: 3 ([id number])  Complications: No apparent anesthesia complications

## 2016-05-23 NOTE — Interval H&P Note (Signed)
History and Physical Interval Note:  05/23/2016 10:00 AM  [name] [name]  has presented today for surgery, with the diagnosis of LEFT HIP OA  The various methods of treatment have been discussed with the patient and family. After consideration of risks, benefits and other options for treatment, the patient has consented to  Procedure(s): LEFT TOTAL HIP ARTHROPLASTY ANTERIOR APPROACH (Left) as a surgical intervention .  The patient's history has been reviewed, patient examined, no change in status, stable for surgery.  I have reviewed the patient's chart and labs.  Questions were answered to the patient's satisfaction.     Mauri Pole

## 2016-05-23 NOTE — Anesthesia Postprocedure Evaluation (Signed)
Anesthesia Post Note  Patient: [name] [name]  Procedure(s) Performed: Procedure(s) (LRB): LEFT TOTAL HIP ARTHROPLASTY ANTERIOR APPROACH (Left)  Patient location during evaluation: PACU Anesthesia Type: Spinal [name]: awake Pain management: pain [name] Vital Signs Assessment: post-procedure vital signs reviewed and stable Respiratory status: spontaneous breathing Cardiovascular status: stable Postop Assessment: no signs of nausea or vomiting and spinal receding Anesthetic complications: no    Last Vitals:  Filed Vitals:   05/23/16 1450 05/23/16 1550  BP: 109/52 99/54  Pulse: 62 60  Temp: 36.7 C 36.4 C  Resp: 14 14    Last Pain:  Filed Vitals:   05/23/16 1608  PainSc: 3                  Janzen Sacks

## 2016-05-24 LAB — CBC
HCT: 27.2 % — ABNORMAL LOW (ref 36.0–46.0)
Hemoglobin: 9.5 g/dL — ABNORMAL LOW (ref 12.0–15.0)
MCH: 30.8 pg (ref 26.0–34.0)
MCHC: 34.9 g/dL (ref 30.0–36.0)
MCV: 88.3 fL (ref 78.0–100.0)
PLATELETS: 198 10*3/uL (ref 150–400)
RBC: 3.08 MIL/uL — ABNORMAL LOW (ref 3.87–5.11)
RDW: 11.7 % (ref 11.5–15.5)
WBC: 8.8 10*3/uL (ref 4.0–10.5)

## 2016-05-24 LAB — BASIC METABOLIC PANEL
ANION GAP: 6 (ref 5–15)
BUN: 13 mg/dL (ref 6–20)
CALCIUM: 8.5 mg/dL — AB (ref 8.9–10.3)
CO2: 26 mmol/L (ref 22–32)
CREATININE: 0.7 mg/dL (ref 0.44–1.00)
Chloride: 102 mmol/L (ref 101–111)
GFR calc Af Amer: >60 mL/min (ref >60)
GLUCOSE: 127 mg/dL — AB (ref 65–99)
Potassium: 4.6 mmol/L (ref 3.5–5.1)
Sodium: 134 mmol/L — ABNORMAL LOW (ref 135–145)

## 2016-05-24 MED ORDER — FERROUS SULFATE 325 (65 FE) MG PO TABS: 325.0000 mg | ORAL_TABLET | Freq: Three times a day (TID) | ORAL | Status: DC

## 2016-05-24 MED ORDER — METHOCARBAMOL 500 MG PO TABS: 500.0000 mg | ORAL_TABLET | Freq: Four times a day (QID) | ORAL | Status: DC | PRN

## 2016-05-24 MED ORDER — POLYETHYLENE GLYCOL 3350 17 G PO PACK: 17.0000 g | PACK | Freq: Two times a day (BID) | ORAL | Status: DC

## 2016-05-24 MED ORDER — HYDROCODONE-ACETAMINOPHEN 7.5-325 MG PO TABS: 1.0000 | ORAL_TABLET | ORAL | Status: DC | PRN

## 2016-05-24 MED ORDER — ASPIRIN 325 MG PO TBEC: 325.0000 mg | DELAYED_RELEASE_TABLET | Freq: Two times a day (BID) | ORAL | Status: DC

## 2016-05-24 MED ORDER — DOCUSATE SODIUM 100 MG PO CAPS: 100.0000 mg | ORAL_CAPSULE | Freq: Two times a day (BID) | ORAL | Status: DC

## 2016-05-24 NOTE — Care Management Note (Signed)
Case Management Note  Patient Details  Name: [name] MRN: [id number] Date of Birth: October 26, 1951  Subjective/Objective:                  LEFT TOTAL HIP ARTHROPLASTY ANTERIOR APPROACH (Left)  Action/Plan: Discharge planning Expected Discharge Date:  05/24/16               Expected Discharge Plan:  Home/Self Care  In-House Referral:     Discharge planning Services  CM Consult  Post Acute Care Choice:    Choice offered to:  Patient  DME Arranged:  N/A DME Agency:  NA  HH Arranged:  NA HH Agency:  NA  Status of Service:  Completed, signed off  If discussed at [place] of Stay Meetings, dates discussed:    Additional Comments: CM met with pt in room to offer choice of home health agency. Pt declines all [facility] services.  Pt states she has rolling walker, crutches and denies need for additional DME.  No other CM needs were communicated. Dellie Catholic, RN 05/24/2016, 11:42 AM

## 2016-05-24 NOTE — Evaluation (Signed)
Occupational Therapy Evaluation Patient Details Name: [name] [name] MRN: [id number] DOB: 29-Nov-1950 Today's Date: 05/24/2016    History of Present Illness L DA THA   Clinical Impression   This 65 year old female was admitted for the above sx. All education was completed. No further OT is needed at this time    Follow Up Recommendations  No OT follow up    Equipment Recommendations  None recommended by OT (has built in seat in shower)    Recommendations for Other Services       Precautions / Restrictions Precautions Precautions: Fall Required Braces or Orthoses: Knee Immobilizer - Left Knee Immobilizer - Left: Discontinue once straight leg raise with < 10 degree lag Restrictions Weight Bearing Restrictions: No      Mobility Bed Mobility            General bed mobility comments: oob  Transfers Overall transfer level: Needs assistance Equipment used: Rolling walker (2 wheeled) Transfers: Sit to/from Stand Sit to Stand: Supervision         General transfer comment: cues for UE placement    Balance                                            ADL Overall ADL's : Needs assistance/impaired             Lower Body Bathing: Minimal assistance;Sit to/from stand       Lower Body Dressing: Minimal assistance;Sit to/from stand   Toilet Transfer: Min guard;Ambulation;Comfort height toilet;RW       Tub/ Shower Transfer: Walk-in shower;Min guard;Ambulation     General ADL Comments: pt will have assistance as needed at home. She wants to try her standard commode at home; was min guard with our comfort height commode     Vision     Perception     Praxis      Pertinent Vitals/Pain Pain Assessment: No/denies pain     Hand Dominance     Extremity/Trunk Assessment Upper Extremity Assessment Upper Extremity Assessment: Overall WFL for tasks assessed      Cervical / Trunk Assessment Cervical / Trunk Assessment: Normal    Communication Communication Communication: No difficulties   Cognition Arousal/Alertness: Awake/alert Behavior During Therapy: WFL for tasks assessed/performed Overall Cognitive Status: Within Functional Limits for tasks assessed                     General Comments       Exercises       Shoulder Instructions      Home Living Family/patient expects to be discharged to:: Private residence Living Arrangements: Spouse/significant other;Children Available Help at Discharge: Family Type of Home: House Home Access: Stairs to enter Technical brewer of Steps: 8 Entrance Stairs-Rails: Left Home Layout: One level     Bathroom Shower/Tub: Occupational psychologist: Standard                Prior Functioning/Environment Level of Independence: Independent             OT Diagnosis: Generalized weakness   OT Problem List:     OT Treatment/Interventions:      OT Goals(Current goals can be found in the care plan section) Acute Rehab OT Goals Patient Stated Goal: to go  home today OT Goal Formulation: All assessment and education complete, DC therapy  OT  Frequency:     Barriers to D/C:            Co-evaluation              End of Session    Activity Tolerance: Patient tolerated treatment well Patient left: in bed;with call bell/phone within reach;with family/visitor present   Time: [id number] OT Time Calculation (min): 12 min Charges:  OT General Charges $OT Visit: 1 Procedure OT Evaluation $OT Eval Low Complexity: 1 Procedure G-Codes:    Nocholas Damaso June 14, 2016, 1:11 PM Lesle Chris, OTR/L [phone] Jun 14, 2016

## 2016-05-24 NOTE — Progress Notes (Signed)
Physical Therapy Treatment Patient Details Name: [name] [name] MRN: [id number] DOB: [birth year] Today's Date: 05/24/2016    History of Present Illness [name] [name]    PT Comments    The patient is progressing very well. Flexes Left hip against gravity> 90 degrees. Ready for DC.  Follow Up Recommendations  No PT follow up     Equipment Recommendations  Crutches    Recommendations for Other Services       Precautions / Restrictions Precautions Precautions: Fall Required Braces or Orthoses: Knee Immobilizer - Left Knee Immobilizer - Left: Discontinue once straight leg raise with < 10 degree lag Restrictions Weight Bearing Restrictions: No    Mobility  Bed Mobility Overal bed mobility: Modified Independent Bed Mobility: Supine to Sit     Supine to sit: Supervision     General bed mobility comments: oob  Transfers Overall transfer level: Modified independent Equipment used: Rolling walker (2 wheeled) Transfers: Sit to/from Stand Sit to Stand: Supervision         General transfer comment: cues for UE placement  Ambulation/Gait Ambulation/Gait assistance: Modified independent (Device/Increase time) Ambulation Distance (Feet): 300 Feet Assistive device: Rolling walker (2 wheeled) Gait Pattern/deviations: Step-through pattern     General Gait Details: gait speed is WNL, swings the  left leg well. Cues for more external rotation   Stairs Stairs: Yes Stairs assistance: Min guard Stair Management: One rail Left;Step to pattern;With crutches Number of Stairs: 4 General stair comments: cues for  safety  Wheelchair Mobility    Modified Rankin (Stroke Patients Only)       Balance                                    Cognition Arousal/Alertness: Awake/alert Behavior During Therapy: WFL for tasks assessed/performed Overall Cognitive Status: Within Functional Limits for tasks assessed                      Exercises Total Joint  Exercises Quad Sets: AROM;Left;10 reps;Supine Short Arc Quad: AROM;Left;10 reps;Supine Heel Slides: AROM;Left;10 reps;Supine Hip ABduction/ADduction: AROM;Left;10 reps;Supine Long Arc Quad: AROM;Standing Knee Flexion: AROM;Standing Marching in Standing: AROM;Standing Standing Hip Extension: AROM;Standing    General Comments        Pertinent Vitals/Pain Pain Assessment: 0-10 Pain Score: 3  Pain Location: [name] thigh Pain Descriptors / Indicators: Tightness Pain Intervention(s): Monitored during session;Premedicated before session    Home Living Family/patient expects to be discharged to:: Private residence Living Arrangements: Spouse/significant other;Children Available Help at Discharge: Family Type of Home: House Home Access: Stairs to enter Entrance Stairs-Rails: Left Home Layout: One level        Prior Function Level of Independence: Independent          PT Goals (current goals can now be found in the care plan section) Acute Rehab PT Goals Patient Stated Goal: to go  home today PT Goal Formulation: With patient/family Time For Goal Achievement: 05/25/16 Potential to Achieve Goals: Good Progress towards PT goals: Progressing toward goals    Frequency  7X/week    PT Plan Current plan remains appropriate    Co-evaluation             End of Session   Activity Tolerance: Patient tolerated treatment well Patient left: in bed;with family/visitor present     Time: [id number] PT Time Calculation (min) (ACUTE ONLY): 16 min  Charges:  $Gait Training: 8-22 mins  G Codes:      Claretha Cooper 05/24/2016, 2:00 PM

## 2016-05-24 NOTE — Progress Notes (Signed)
     Subjective: 1 Day Post-Op Procedure(s) (LRB): LEFT TOTAL HIP ARTHROPLASTY ANTERIOR APPROACH (Left)   Patient reports pain as mild, pain controlled. No events throughout the night. Wants to recover well , she is already scheduled for the right THA, AA in August.  Ready to be discharged home, if she does well with PT.  Objective:   VITALS:   Filed Vitals:   05/24/16 0058 05/24/16 0624  BP: 115/57 99/50  Pulse: 69 63  Temp: 98 F (36.7 C) 98 F (36.7 C)  Resp: 14 15    Dorsiflexion/Plantar flexion intact Incision: dressing C/D/I No cellulitis present Compartment soft  LABS  Recent Labs  05/24/16 0424  HGB 9.5*  HCT 27.2*  WBC 8.8  PLT 198     Recent Labs  05/24/16 0424  NA 134*  K 4.6  BUN 13  CREATININE 0.70  GLUCOSE 127*     Assessment/Plan: 1 Day Post-Op Procedure(s) (LRB): LEFT TOTAL HIP ARTHROPLASTY ANTERIOR APPROACH (Left) Foley cath d/c'ed Advance diet Up with therapy D/C IV fluids Discharge home with home health  Follow up in 2 weeks at [facility]. Follow up with OLIN,Jenifer Struve D in 2 weeks.  Contact information:  [facility] [address], [address] [place]   PAC  05/24/2016, 8:02 AM

## 2016-05-24 NOTE — Evaluation (Addendum)
Physical Therapy Evaluation Patient Details Name: [name] [name] MRN: [id number] DOB: October 07, 1951 Today's Date: 05/24/2016   History of Present Illness  L THA Direct anterior  Clinical Impression  The patient is walking well POD 1. Plans DC today after therapy. Pt admitted with above diagnosis. Pt currently with functional limitations due to the deficits listed below (see PT Problem List). Pt will benefit from skilled PT to increase their independence and safety with mobility to allow discharge to the venue listed below.       Follow Up Recommendations Home health PT;Supervision/Assistance - 24 hour    Equipment Recommendations  Crutches;Rolling walker with 5" wheels    Recommendations for Other Services       Precautions / Restrictions Precautions- fall      Mobility  Bed Mobility Overal bed mobility: Needs Assistance Bed Mobility: Supine to Sit     Supine to sit: Supervision     General bed mobility comments: manages L leg  Transfers Overall transfer level: Needs assistance Equipment used: Rolling walker (2 wheeled) Transfers: Sit to/from Stand Sit to Stand: Supervision         General transfer comment: cues for hand placement  Ambulation/Gait Ambulation/Gait assistance: Supervision Ambulation Distance (Feet): 200 Feet Assistive device: Rolling walker (2 wheeled) Gait Pattern/deviations: Step-through pattern     General Gait Details: gait speed is WNL, swings the  left leg well. Cues for more external rotation  Stairs            Wheelchair Mobility    Modified Rankin (Stroke Patients Only)       Balance                                             Pertinent Vitals/Pain Pain Assessment: No/denies pain    Home Living Family/patient expects to be discharged to:: Private residence Living Arrangements: Spouse/significant other;Children Available Help at Discharge: Family Type of Home: House Home Access: Stairs to  enter Entrance Stairs-Rails: Left Entrance Stairs-Number of Steps: 8 Home Layout: One level        Prior Function Level of Independence: Independent               Hand Dominance        Extremity/Trunk Assessment   Upper Extremity Assessment: Defer to OT evaluation           Lower Extremity Assessment: LLE deficits/detail   LLE Deficits / Details: flexes hip to 100o degrees in supine  Cervical / Trunk Assessment: Normal  Communication   Communication: No difficulties  Cognition Arousal/Alertness: Awake/alert Behavior During Therapy: WFL for tasks assessed/performed Overall Cognitive Status: Within Functional Limits for tasks assessed                      General Comments      Exercises Total Joint Exercises Quad Sets: AROM;Left;10 reps;Supine Short Arc Quad: AROM;Left;10 reps;Supine Heel Slides: AROM;Left;10 reps;Supine Hip ABduction/ADduction: AROM;Left;10 reps;Supine Long Arc Quad: AROM;Left;10 reps;Seated      Assessment/Plan    PT Assessment Patient needs continued PT services  PT Diagnosis Difficulty walking   PT Problem List Decreased strength;Decreased range of motion;Decreased activity tolerance;Decreased knowledge of use of DME;Decreased safety awareness;Decreased knowledge of precautions;Decreased mobility  PT Treatment Interventions DME instruction;Gait training;Stair training;Functional mobility training;Therapeutic activities;Therapeutic exercise;Patient/family education   PT Goals (Current goals can be found in the Care  Plan section) Acute Rehab PT Goals Patient Stated Goal: to go  home today PT Goal Formulation: With patient/family Time For Goal Achievement: 05/25/16 Potential to Achieve Goals: Good    Frequency 7X/week   Barriers to discharge        Co-evaluation               End of Session   Activity Tolerance: Patient tolerated treatment well Patient left:  (with OT) Nurse Communication: Mobility status          Time: 1000-1027 PT Time Calculation (min) (ACUTE ONLY): 27 min   Charges:   PT Evaluation $PT Eval Low Complexity: 1 Procedure PT Treatments $Gait Training: 8-22 mins   PT G CodesMarcelino Freestone PT [id number]  05/24/2016, 11:58 AM

## 2016-05-24 NOTE — Discharge Instructions (Signed)
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
## 2016-06-05 NOTE — Progress Notes (Signed)
Please place orders in EPIC as patient has a pre-op appointment with the nurse on 06/13/2016 at 0900 am! Thank you!

## 2016-06-13 ENCOUNTER — Other Ambulatory Visit (HOSPITAL_COMMUNITY): Payer: 59

## 2016-06-14 ENCOUNTER — Encounter (HOSPITAL_COMMUNITY): Payer: Self-pay | Admitting: *Deleted

## 2016-06-14 ENCOUNTER — Encounter (HOSPITAL_COMMUNITY)
Admission: RE | Admit: 2016-06-14 | Discharge: 2016-06-14 | Disposition: A | Discharge status: Home / Self Care | Payer: 59 | Source: Ambulatory Visit | Attending: Orthopedic Surgery | Admitting: Orthopedic Surgery

## 2016-06-14 DIAGNOSIS — Z0183 Encounter for blood typing: Secondary | ICD-10-CM | POA: Insufficient documentation

## 2016-06-14 DIAGNOSIS — Z01812 Encounter for preprocedural laboratory examination: Secondary | ICD-10-CM | POA: Insufficient documentation

## 2016-06-14 DIAGNOSIS — M1611 Unilateral primary osteoarthritis, right hip: Secondary | ICD-10-CM | POA: Diagnosis not present

## 2016-06-14 LAB — BASIC METABOLIC PANEL
Anion gap: 6 (ref 5–15)
BUN: 16 mg/dL (ref 6–20)
CHLORIDE: 105 mmol/L (ref 101–111)
CO2: 29 mmol/L (ref 22–32)
CREATININE: 0.71 mg/dL (ref 0.44–1.00)
Calcium: 9.6 mg/dL (ref 8.9–10.3)
Glucose, Bld: 90 mg/dL (ref 65–99)
POTASSIUM: 3.8 mmol/L (ref 3.5–5.1)
SODIUM: 140 mmol/L (ref 135–145)

## 2016-06-14 LAB — PROTIME-INR
INR: 0.97
PROTHROMBIN TIME: 12.9 s (ref 11.4–15.2)

## 2016-06-14 LAB — SURGICAL PCR SCREEN
MRSA, PCR: NEGATIVE
STAPHYLOCOCCUS AUREUS: NEGATIVE

## 2016-06-14 LAB — CBC
HCT: 35.4 % — ABNORMAL LOW (ref 36.0–46.0)
HEMOGLOBIN: 11.5 g/dL — AB (ref 12.0–15.0)
MCH: 30 pg (ref 26.0–34.0)
MCHC: 32.5 g/dL (ref 30.0–36.0)
MCV: 92.4 fL (ref 78.0–100.0)
PLATELETS: 299 10*3/uL (ref 150–400)
RBC: 3.83 MIL/uL — AB (ref 3.87–5.11)
RDW: 13.1 % (ref 11.5–15.5)
WBC: 3.9 10*3/uL — ABNORMAL LOW (ref 4.0–10.5)

## 2016-06-14 NOTE — Patient Instructions (Signed)
[name] [name]  06/14/2016   Your procedure is scheduled on: Tuesday June 20, 2016  Report to [facility]  Entrance take [place]  elevators to 3rd floor to  [place] at 7:00 AM.  Call this number if you have problems the morning of surgery [phone]   Remember: ONLY 1 PERSON MAY GO WITH YOU TO SHORT STAY TO GET  READY MORNING OF [place].  Do not eat food or drink liquids :After Midnight.     Take these medicines the morning of surgery with A SIP OF WATER: May take Hydrocodone-Acetaminophen if needed                                You may not have any metal on your body including hair pins and              piercings  Do not wear jewelry, make-up, lotions, powders or perfumes, deodorant             Do not wear nail polish.  Do not shave  48 hours prior to surgery.                Do not bring valuables to the hospital. [place].  Contacts, dentures or bridgework may not be worn into surgery.  Leave suitcase in the car. After surgery it may be brought to your room.                Please read over the following fact sheets you were given:MRSA INFORMATION SHEET; INCENTIVE SPIROMETER; BLOOD TRANSFUSION INFORMATION SHEET  _____________________________________________________________________             [facility] - Preparing for Surgery Before surgery, you can play an important role.  Because skin is not sterile, your skin needs to be as free of germs as possible.  You can reduce the number of germs on your skin by washing with CHG (chlorahexidine gluconate) soap before surgery.  CHG is an antiseptic cleaner which kills germs and bonds with the skin to continue killing germs even after washing. Please DO NOT use if you have an allergy to CHG or antibacterial soaps.  If your skin becomes reddened/irritated stop using the CHG and inform your nurse when you arrive at Short Stay. Do not shave  (including legs and underarms) for at least 48 hours prior to the first CHG shower.  You may shave your face/neck. Please follow these instructions carefully:  1.  Shower with CHG Soap the night before surgery and the  morning of Surgery.  2.  If you choose to wash your hair, wash your hair first as usual with your  normal  shampoo.  3.  After you shampoo, rinse your hair and body thoroughly to remove the  shampoo.                           4.  Use CHG as you would any other liquid soap.  You can apply chg directly  to the skin and wash                       Gently with a scrungie or clean  washcloth.  5.  Apply the CHG Soap to your body ONLY FROM THE NECK DOWN.   Do not use on face/ open                           Wound or open sores. Avoid contact with eyes, ears mouth and genitals (private parts).                       Wash face,  Genitals (private parts) with your normal soap.             6.  Wash thoroughly, paying special attention to the area where your surgery  will be performed.  7.  Thoroughly rinse your body with warm water from the neck down.  8.  DO NOT shower/wash with your normal soap after using and rinsing off  the CHG Soap.                9.  Pat yourself dry with a clean towel.            10.  Wear clean pajamas.            11.  Place clean sheets on your bed the night of your first shower and do not  sleep with pets. Day of Surgery : Do not apply any lotions/deodorants the morning of surgery.  Please wear clean clothes to the hospital/surgery center.  FAILURE TO FOLLOW THESE INSTRUCTIONS MAY RESULT IN THE CANCELLATION OF YOUR SURGERY PATIENT SIGNATURE_________________________________  NURSE SIGNATURE__________________________________  ________________________________________________________________________   Adam Phenix  An incentive spirometer is a tool that can help keep your lungs clear and active. This tool measures how well you are filling your lungs with  each breath. Taking long deep breaths may help reverse or decrease the chance of developing breathing (pulmonary) problems (especially infection) following:  A long period of time when you are unable to move or be active. BEFORE THE PROCEDURE   If the spirometer includes an indicator to show your best effort, your nurse or respiratory therapist will set it to a desired goal.  If possible, sit up straight or lean slightly forward. Try not to slouch.  Hold the incentive spirometer in an upright position. INSTRUCTIONS FOR USE  1. Sit on the edge of your bed if possible, or sit up as far as you can in bed or on a chair. 2. Hold the incentive spirometer in an upright position. 3. Breathe out normally. 4. Place the mouthpiece in your mouth and seal your lips tightly around it. 5. Breathe in slowly and as deeply as possible, raising the piston or the ball toward the top of the column. 6. Hold your breath for 3-5 seconds or for as long as possible. Allow the piston or ball to fall to the bottom of the column. 7. Remove the mouthpiece from your mouth and breathe out normally. 8. Rest for a few seconds and repeat Steps 1 through 7 at least 10 times every 1-2 hours when you are awake. Take your time and take a few normal breaths between deep breaths. 9. The spirometer may include an indicator to show your best effort. Use the indicator as a goal to work toward during each repetition. 10. After each set of 10 deep breaths, practice coughing to be sure your lungs are clear. If you have an incision (the cut made at the time of surgery), support your incision when coughing by  placing a pillow or rolled up towels firmly against it. Once you are able to get out of bed, walk around indoors and cough well. You may stop using the incentive spirometer when instructed by your caregiver.  RISKS AND COMPLICATIONS  Take your time so you do not get dizzy or light-headed.  If you are in pain, you may need to take or  ask for pain medication before doing incentive spirometry. It is harder to take a deep breath if you are having pain. AFTER USE  Rest and breathe slowly and easily.  It can be helpful to keep track of a log of your progress. Your caregiver can provide you with a simple table to help with this. If you are using the spirometer at home, follow these instructions: [place] IF:   You are having difficultly using the spirometer.  You have trouble using the spirometer as often as instructed.  Your pain medication is not giving enough relief while using the spirometer.  You develop fever of 100.5 F (38.1 C) or higher. SEEK IMMEDIATE MEDICAL CARE IF:   You cough up bloody sputum that had not been present before.  You develop fever of 102 F (38.9 C) or greater.  You develop worsening pain at or near the incision site. MAKE SURE YOU:   Understand these instructions.  Will watch your condition.  Will get help right away if you are not doing well or get worse. Document Released: 03/12/2007 Document Revised: 01/22/2012 Document Reviewed: 05/13/2007 ExitCare Patient Information 2014 ExitCare, [place].   ________________________________________________________________________  WHAT IS A BLOOD TRANSFUSION? Blood Transfusion Information  A transfusion is the replacement of blood or some of its parts. Blood is made up of multiple cells which provide different functions.  Red blood cells carry oxygen and are used for blood loss replacement.  White blood cells fight against infection.  Platelets control bleeding.  Plasma helps clot blood.  Other blood products are available for specialized needs, such as hemophilia or other clotting disorders. BEFORE THE TRANSFUSION  Who gives blood for transfusions?   Healthy volunteers who are fully evaluated to make sure their blood is safe. This is blood bank blood. Transfusion therapy is the safest it has ever been in the practice of  medicine. Before blood is taken from a donor, a complete history is taken to make sure that person has no history of diseases nor engages in risky social behavior (examples are intravenous drug use or sexual activity with multiple partners). The donor's travel history is screened to minimize risk of transmitting infections, such as malaria. The donated blood is tested for signs of infectious diseases, such as HIV and hepatitis. The blood is then tested to be sure it is compatible with you in order to minimize the chance of a transfusion reaction. If you or a relative donates blood, this is often done in anticipation of surgery and is not appropriate for emergency situations. It takes many days to process the donated blood. RISKS AND COMPLICATIONS Although transfusion therapy is very safe and saves many lives, the main dangers of transfusion include:   Getting an infectious disease.  Developing a transfusion reaction. This is an allergic reaction to something in the blood you were given. Every precaution is taken to prevent this. The decision to have a blood transfusion has been considered carefully by your caregiver before blood is given. Blood is not given unless the benefits outweigh the risks. AFTER THE TRANSFUSION  Right after receiving a blood  transfusion, you will usually feel much better and more energetic. This is especially true if your red blood cells have gotten low (anemic). The transfusion raises the level of the red blood cells which carry oxygen, and this usually causes an energy increase.  The nurse administering the transfusion will monitor you carefully for complications. HOME CARE INSTRUCTIONS  No special instructions are needed after a transfusion. You may find your energy is better. Speak with your caregiver about any limitations on activity for underlying diseases you may have. SEEK MEDICAL CARE IF:   Your condition is not improving after your transfusion.  You develop  redness or irritation at the intravenous (IV) site. SEEK IMMEDIATE MEDICAL CARE IF:  Any of the following symptoms occur over the next 12 hours:  Shaking chills.  You have a temperature by mouth above 102 F (38.9 C), not controlled by medicine.  Chest, back, or muscle pain.  People around you feel you are not acting correctly or are confused.  Shortness of breath or difficulty breathing.  Dizziness and fainting.  You get a rash or develop hives.  You have a decrease in urine output.  Your urine turns a dark color or changes to pink, red, or brown. Any of the following symptoms occur over the next 10 days:  You have a temperature by mouth above 102 F (38.9 C), not controlled by medicine.  Shortness of breath.  Weakness after normal activity.  The white part of the eye turns yellow (jaundice).  You have a decrease in the amount of urine or are urinating less often.  Your urine turns a dark color or changes to pink, red, or brown. Document Released: 10/27/2000 Document Revised: 01/22/2012 Document Reviewed: 06/15/2008 [facility] Patient Information 2014 [place], [place].  _______________________________________________________________________
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
## 2016-06-20 ENCOUNTER — Inpatient Hospital Stay (HOSPITAL_COMMUNITY): Payer: 59

## 2016-06-20 ENCOUNTER — Encounter (HOSPITAL_COMMUNITY)
Admission: RE | Disposition: A | Discharge status: Home Health | Payer: Self-pay | Source: Ambulatory Visit | Attending: Orthopedic Surgery

## 2016-06-20 ENCOUNTER — Inpatient Hospital Stay (HOSPITAL_COMMUNITY)
Admission: RE | Admit: 2016-06-20 | Discharge: 2016-06-21 | DRG: 470 | Disposition: A | Discharge status: Home Health | Payer: 59 | Source: Ambulatory Visit | Attending: Orthopedic Surgery | Admitting: Orthopedic Surgery

## 2016-06-20 ENCOUNTER — Inpatient Hospital Stay (HOSPITAL_COMMUNITY): Payer: 59 | Admitting: Anesthesiology

## 2016-06-20 ENCOUNTER — Encounter (HOSPITAL_COMMUNITY): Payer: Self-pay

## 2016-06-20 DIAGNOSIS — M1611 Unilateral primary osteoarthritis, right hip: Principal | ICD-10-CM | POA: Diagnosis present

## 2016-06-20 DIAGNOSIS — M25551 Pain in right hip: Secondary | ICD-10-CM | POA: Diagnosis present

## 2016-06-20 DIAGNOSIS — Z96642 Presence of left artificial hip joint: Secondary | ICD-10-CM | POA: Diagnosis present

## 2016-06-20 DIAGNOSIS — Z96641 Presence of right artificial hip joint: Secondary | ICD-10-CM | POA: Diagnosis not present

## 2016-06-20 DIAGNOSIS — Z96649 Presence of unspecified artificial hip joint: Secondary | ICD-10-CM

## 2016-06-20 DIAGNOSIS — Z471 Aftercare following joint replacement surgery: Secondary | ICD-10-CM | POA: Diagnosis not present

## 2016-06-20 HISTORY — PX: TOTAL HIP ARTHROPLASTY: SHX124

## 2016-06-20 LAB — TYPE AND SCREEN
ABO/RH(D): A POS
Antibody Screen: NEGATIVE

## 2016-06-20 SURGERY — ARTHROPLASTY, HIP, TOTAL, ANTERIOR APPROACH
Anesthesia: Spinal | Site: Hip | Laterality: Right

## 2016-06-20 MED ORDER — CHLORHEXIDINE GLUCONATE 4 % EX LIQD: 60.0000 mL | Freq: Once | CUTANEOUS | Status: DC

## 2016-06-20 MED ORDER — LIDOCAINE HCL (CARDIAC) 20 MG/ML IV SOLN
INTRAVENOUS | Status: DC | PRN
  Administered 2016-06-20: 50 mg via INTRAVENOUS

## 2016-06-20 MED ORDER — PHENYLEPHRINE HCL 10 MG/ML IJ SOLN
INTRAMUSCULAR | Status: DC | PRN
  Administered 2016-06-20: 80 ug via INTRAVENOUS

## 2016-06-20 MED ORDER — BUPIVACAINE IN DEXTROSE 0.75-8.25 % IT SOLN
INTRATHECAL | Status: DC | PRN
Start: 2016-06-20 — End: 2016-06-20
  Administered 2016-06-20: 2 mL via INTRATHECAL

## 2016-06-20 MED ORDER — ONDANSETRON HCL 4 MG/2ML IJ SOLN
INTRAMUSCULAR | Status: DC | PRN
  Administered 2016-06-20: 4 mg via INTRAVENOUS

## 2016-06-20 MED ORDER — PHENYLEPHRINE 40 MCG/ML (10ML) SYRINGE FOR IV PUSH (FOR BLOOD PRESSURE SUPPORT)
PREFILLED_SYRINGE | INTRAVENOUS | Status: AC
  Filled 2016-06-20: qty 10

## 2016-06-20 MED ORDER — VANCOMYCIN HCL IN DEXTROSE 1-5 GM/200ML-% IV SOLN
1000.0000 mg | INTRAVENOUS | Status: AC
  Administered 2016-06-20: 1000 mg via INTRAVENOUS
  Filled 2016-06-20: qty 200

## 2016-06-20 MED ORDER — TRANEXAMIC ACID 1000 MG/10ML IV SOLN
1000.0000 mg | Freq: Once | INTRAVENOUS | Status: AC
  Administered 2016-06-20: 1000 mg via INTRAVENOUS
  Filled 2016-06-20 (×2): qty 10

## 2016-06-20 MED ORDER — ONDANSETRON HCL 4 MG PO TABS: 4.0000 mg | ORAL_TABLET | Freq: Four times a day (QID) | ORAL | Status: DC | PRN

## 2016-06-20 MED ORDER — TRANEXAMIC ACID 1000 MG/10ML IV SOLN
1000.0000 mg | INTRAVENOUS | Status: AC
  Administered 2016-06-20: 1000 mg via INTRAVENOUS
  Filled 2016-06-20: qty 1100

## 2016-06-20 MED ORDER — HYDROCODONE-ACETAMINOPHEN 7.5-325 MG PO TABS
1.0000 | ORAL_TABLET | ORAL | Status: DC
  Administered 2016-06-20 – 2016-06-21 (×6): 1 via ORAL
  Filled 2016-06-20 (×3): qty 1
  Filled 2016-06-20: qty 2
  Filled 2016-06-20: qty 1

## 2016-06-20 MED ORDER — LIDOCAINE HCL (CARDIAC) 20 MG/ML IV SOLN
INTRAVENOUS | Status: AC
  Filled 2016-06-20: qty 5

## 2016-06-20 MED ORDER — POLYETHYLENE GLYCOL 3350 17 G PO PACK
17.0000 g | PACK | Freq: Two times a day (BID) | ORAL | Status: DC
  Administered 2016-06-20 – 2016-06-21 (×2): 17 g via ORAL
  Filled 2016-06-20 (×2): qty 1

## 2016-06-20 MED ORDER — DEXAMETHASONE SODIUM PHOSPHATE 10 MG/ML IJ SOLN
10.0000 mg | Freq: Once | INTRAMUSCULAR | Status: AC
  Administered 2016-06-21: 10 mg via INTRAVENOUS
  Filled 2016-06-20: qty 1

## 2016-06-20 MED ORDER — FERROUS SULFATE 325 (65 FE) MG PO TABS
325.0000 mg | ORAL_TABLET | Freq: Three times a day (TID) | ORAL | Status: DC
  Administered 2016-06-20 – 2016-06-21 (×2): 325 mg via ORAL
  Filled 2016-06-20 (×2): qty 1

## 2016-06-20 MED ORDER — HYDROMORPHONE HCL 1 MG/ML IJ SOLN
0.5000 mg | INTRAMUSCULAR | Status: DC | PRN
  Administered 2016-06-20: 1 mg via INTRAVENOUS
  Filled 2016-06-20: qty 1

## 2016-06-20 MED ORDER — PROPOFOL 10 MG/ML IV BOLUS
INTRAVENOUS | Status: DC | PRN
  Administered 2016-06-20 (×2): 20 mg via INTRAVENOUS
  Administered 2016-06-20: 10 mg via INTRAVENOUS

## 2016-06-20 MED ORDER — BISACODYL 10 MG RE SUPP: 10.0000 mg | Freq: Every day | RECTAL | Status: DC | PRN

## 2016-06-20 MED ORDER — ONDANSETRON HCL 4 MG/2ML IJ SOLN: 4.0000 mg | Freq: Four times a day (QID) | INTRAMUSCULAR | Status: DC | PRN

## 2016-06-20 MED ORDER — PROPOFOL 10 MG/ML IV BOLUS
INTRAVENOUS | Status: AC
  Filled 2016-06-20: qty 60

## 2016-06-20 MED ORDER — STERILE WATER FOR IRRIGATION IR SOLN
Status: DC | PRN
  Administered 2016-06-20: 2000 mL

## 2016-06-20 MED ORDER — SODIUM CHLORIDE 0.9 % IR SOLN
Status: DC | PRN
  Administered 2016-06-20: 1000 mL

## 2016-06-20 MED ORDER — MAGNESIUM CITRATE PO SOLN: 1.0000 | Freq: Once | ORAL | Status: DC | PRN

## 2016-06-20 MED ORDER — MENTHOL 3 MG MT LOZG: 1.0000 | LOZENGE | OROMUCOSAL | Status: DC | PRN

## 2016-06-20 MED ORDER — LACTATED RINGERS IV SOLN
INTRAVENOUS | Status: DC
  Administered 2016-06-20 (×2): via INTRAVENOUS

## 2016-06-20 MED ORDER — HYDROMORPHONE HCL 1 MG/ML IJ SOLN
INTRAMUSCULAR | Status: AC
  Filled 2016-06-20: qty 1

## 2016-06-20 MED ORDER — METOCLOPRAMIDE HCL 5 MG PO TABS: 5.0000 mg | ORAL_TABLET | Freq: Three times a day (TID) | ORAL | Status: DC | PRN

## 2016-06-20 MED ORDER — MIDAZOLAM HCL 2 MG/2ML IJ SOLN
INTRAMUSCULAR | Status: AC
  Filled 2016-06-20: qty 2

## 2016-06-20 MED ORDER — SODIUM CHLORIDE 0.9 % IV SOLN
100.0000 mL/h | INTRAVENOUS | Status: DC
  Administered 2016-06-20: 100 mL/h via INTRAVENOUS
  Filled 2016-06-20 (×3): qty 1000

## 2016-06-20 MED ORDER — DEXAMETHASONE SODIUM PHOSPHATE 10 MG/ML IJ SOLN
INTRAMUSCULAR | Status: DC | PRN
  Administered 2016-06-20: 10 mg via INTRAVENOUS

## 2016-06-20 MED ORDER — PROPOFOL 500 MG/50ML IV EMUL
INTRAVENOUS | Status: DC | PRN
  Administered 2016-06-20: 25 ug/kg/min via INTRAVENOUS
  Administered 2016-06-20: 100 ug/kg/min via INTRAVENOUS

## 2016-06-20 MED ORDER — KETOROLAC TROMETHAMINE 30 MG/ML IJ SOLN: 30.0000 mg | Freq: Once | INTRAMUSCULAR | Status: DC | PRN

## 2016-06-20 MED ORDER — FENTANYL CITRATE (PF) 100 MCG/2ML IJ SOLN
INTRAMUSCULAR | Status: DC | PRN
  Administered 2016-06-20 (×4): 25 ug via INTRAVENOUS

## 2016-06-20 MED ORDER — VANCOMYCIN HCL IN DEXTROSE 1-5 GM/200ML-% IV SOLN
1000.0000 mg | Freq: Two times a day (BID) | INTRAVENOUS | Status: AC
  Administered 2016-06-20: 1000 mg via INTRAVENOUS
  Filled 2016-06-20: qty 200

## 2016-06-20 MED ORDER — EPHEDRINE SULFATE 50 MG/ML IJ SOLN
INTRAMUSCULAR | Status: AC
  Filled 2016-06-20: qty 1

## 2016-06-20 MED ORDER — ASPIRIN 81 MG PO CHEW
81.0000 mg | CHEWABLE_TABLET | Freq: Two times a day (BID) | ORAL | Status: DC
  Administered 2016-06-20 – 2016-06-21 (×2): 81 mg via ORAL
  Filled 2016-06-20 (×2): qty 1

## 2016-06-20 MED ORDER — PHENOL 1.4 % MT LIQD: 1.0000 | OROMUCOSAL | Status: DC | PRN

## 2016-06-20 MED ORDER — MIDAZOLAM HCL 5 MG/5ML IJ SOLN
INTRAMUSCULAR | Status: DC | PRN
  Administered 2016-06-20 (×2): 1 mg via INTRAVENOUS

## 2016-06-20 MED ORDER — DEXAMETHASONE SODIUM PHOSPHATE 10 MG/ML IJ SOLN
INTRAMUSCULAR | Status: AC
  Filled 2016-06-20: qty 1

## 2016-06-20 MED ORDER — ONDANSETRON HCL 4 MG/2ML IJ SOLN
INTRAMUSCULAR | Status: AC
  Filled 2016-06-20: qty 2

## 2016-06-20 MED ORDER — METHOCARBAMOL 500 MG PO TABS: 500.0000 mg | ORAL_TABLET | Freq: Four times a day (QID) | ORAL | Status: DC | PRN

## 2016-06-20 MED ORDER — DIPHENHYDRAMINE HCL 25 MG PO CAPS: 25.0000 mg | ORAL_CAPSULE | Freq: Four times a day (QID) | ORAL | Status: DC | PRN

## 2016-06-20 MED ORDER — PROMETHAZINE HCL 25 MG/ML IJ SOLN: 6.2500 mg | INTRAMUSCULAR | Status: DC | PRN

## 2016-06-20 MED ORDER — METHOCARBAMOL 1000 MG/10ML IJ SOLN
500.0000 mg | Freq: Four times a day (QID) | INTRAVENOUS | Status: DC | PRN
  Administered 2016-06-20: 500 mg via INTRAVENOUS
  Filled 2016-06-20: qty 5
  Filled 2016-06-20: qty 550

## 2016-06-20 MED ORDER — METOCLOPRAMIDE HCL 5 MG/ML IJ SOLN
5.0000 mg | Freq: Three times a day (TID) | INTRAMUSCULAR | Status: DC | PRN
Start: 2016-06-20 — End: 2016-06-21

## 2016-06-20 MED ORDER — EPHEDRINE SULFATE 50 MG/ML IJ SOLN
INTRAMUSCULAR | Status: DC | PRN
  Administered 2016-06-20 (×3): 5 mg via INTRAVENOUS

## 2016-06-20 MED ORDER — SODIUM CHLORIDE 0.9 % IJ SOLN
INTRAMUSCULAR | Status: AC
  Filled 2016-06-20: qty 10

## 2016-06-20 MED ORDER — HYDROMORPHONE HCL 1 MG/ML IJ SOLN
0.2500 mg | INTRAMUSCULAR | Status: DC | PRN
  Administered 2016-06-20 (×2): 0.5 mg via INTRAVENOUS

## 2016-06-20 MED ORDER — DEXAMETHASONE SODIUM PHOSPHATE 10 MG/ML IJ SOLN: 10.0000 mg | Freq: Once | INTRAMUSCULAR | Status: DC

## 2016-06-20 MED ORDER — CELECOXIB 200 MG PO CAPS
200.0000 mg | ORAL_CAPSULE | Freq: Two times a day (BID) | ORAL | Status: DC
  Administered 2016-06-20 – 2016-06-21 (×2): 200 mg via ORAL
  Filled 2016-06-20 (×2): qty 1

## 2016-06-20 MED ORDER — FENTANYL CITRATE (PF) 100 MCG/2ML IJ SOLN
INTRAMUSCULAR | Status: AC
  Filled 2016-06-20: qty 2

## 2016-06-20 MED ORDER — DOCUSATE SODIUM 100 MG PO CAPS
100.0000 mg | ORAL_CAPSULE | Freq: Two times a day (BID) | ORAL | Status: DC
  Administered 2016-06-20 – 2016-06-21 (×2): 100 mg via ORAL
  Filled 2016-06-20 (×2): qty 1

## 2016-06-20 MED ORDER — ALUM & MAG HYDROXIDE-SIMETH 200-200-20 MG/5ML PO SUSP: 30.0000 mL | ORAL | Status: DC | PRN

## 2016-06-20 SURGICAL SUPPLY — 34 items
BAG ZIPLOCK 12X15 (MISCELLANEOUS) ×3 IMPLANT
CAPT HIP TOTAL 2 ×3 IMPLANT
CLOTH BEACON ORANGE TIMEOUT ST (SAFETY) ×3 IMPLANT
COVER PERINEAL POST (MISCELLANEOUS) ×3 IMPLANT
DRAPE STERI IOBAN 125X83 (DRAPES) ×3 IMPLANT
DRAPE U-SHAPE 47X51 STRL (DRAPES) ×6 IMPLANT
DRESSING AQUACEL AG SP 3.5X10 (GAUZE/BANDAGES/DRESSINGS) ×1 IMPLANT
DRSG AQUACEL AG ADV 3.5X10 (GAUZE/BANDAGES/DRESSINGS) ×3 IMPLANT
DRSG AQUACEL AG SP 3.5X10 (GAUZE/BANDAGES/DRESSINGS) ×3
DURAPREP 26ML APPLICATOR (WOUND CARE) ×3 IMPLANT
ELECT REM PT RETURN 9FT ADLT (ELECTROSURGICAL) ×3
ELECTRODE REM PT RTRN 9FT ADLT (ELECTROSURGICAL) ×1 IMPLANT
GLOVE BIOGEL PI IND STRL 7.0 (GLOVE) ×2 IMPLANT
GLOVE BIOGEL PI IND STRL 7.5 (GLOVE) ×4 IMPLANT
GLOVE BIOGEL PI IND STRL 8.5 (GLOVE) ×1 IMPLANT
GLOVE BIOGEL PI INDICATOR 7.0 (GLOVE) ×4
GLOVE BIOGEL PI INDICATOR 7.5 (GLOVE) ×8
GLOVE BIOGEL PI INDICATOR 8.5 (GLOVE) ×2
GLOVE ECLIPSE 8.0 STRL XLNG CF (GLOVE) ×6 IMPLANT
GLOVE ORTHO TXT STRL SZ7.5 (GLOVE) ×3 IMPLANT
GLOVE SURG SS PI 7.5 STRL IVOR (GLOVE) ×3 IMPLANT
GOWN STRL REUS W/TWL LRG LVL3 (GOWN DISPOSABLE) ×3 IMPLANT
GOWN STRL REUS W/TWL XL LVL3 (GOWN DISPOSABLE) ×9 IMPLANT
HOLDER FOLEY CATH W/STRAP (MISCELLANEOUS) ×3 IMPLANT
LIQUID BAND (GAUZE/BANDAGES/DRESSINGS) ×3 IMPLANT
PACK ANTERIOR HIP CUSTOM (KITS) ×3 IMPLANT
SAW OSC TIP CART 19.5X105X1.3 (SAW) ×3 IMPLANT
SUT MNCRL AB 4-0 PS2 18 (SUTURE) ×3 IMPLANT
SUT VIC AB 1 CT1 36 (SUTURE) ×9 IMPLANT
SUT VIC AB 2-0 CT1 27 (SUTURE) ×4
SUT VIC AB 2-0 CT1 TAPERPNT 27 (SUTURE) ×2 IMPLANT
SUT VLOC 180 0 24IN GS25 (SUTURE) ×3 IMPLANT
TRAY FOLEY W/METER SILVER 14FR (SET/KITS/TRAYS/PACK) ×3 IMPLANT
YANKAUER SUCT BULB TIP 10FT TU (MISCELLANEOUS) ×3 IMPLANT

## 2016-06-20 NOTE — Anesthesia Preprocedure Evaluation (Signed)
Anesthesia Evaluation  Patient identified by MRN, date of birth, ID band Patient awake    Reviewed: Allergy & Precautions, NPO status , Patient's Chart, lab work & pertinent test results  History of Anesthesia Complications Negative for: history of anesthetic complications  Airway Mallampati: II  TM Distance: >3 FB Neck ROM: Full    Dental  (+) Teeth Intact   Pulmonary neg pulmonary ROS,    breath sounds clear to auscultation       Cardiovascular negative cardio ROS   Rhythm:Regular     Neuro/Psych Previous cervical fracture s/p repair without neurologic deficits   Neuromuscular disease negative psych ROS   GI/Hepatic negative GI ROS, Neg liver ROS,   Endo/Other  negative endocrine ROS  Renal/GU negative Renal ROS     Musculoskeletal  (+) Arthritis ,   Abdominal   Peds  Hematology  (+) anemia ,   Anesthesia Other Findings   Reproductive/Obstetrics                             Lab Results  Component Value Date   WBC 3.9 (L) 06/14/2016   HGB 11.5 (L) 06/14/2016   HCT 35.4 (L) 06/14/2016   MCV 92.4 06/14/2016   PLT 299 06/14/2016   Lab Results  Component Value Date   CREATININE 0.71 06/14/2016   BUN 16 06/14/2016   NA 140 06/14/2016   K 3.8 06/14/2016   CL 105 06/14/2016   CO2 29 06/14/2016   Lab Results  Component Value Date   INR 0.97 06/14/2016     Anesthesia Physical  Anesthesia Plan  ASA: I  Anesthesia Plan: Spinal   Post-op Pain Management:    Induction:   Airway Management Planned: Natural Airway, Nasal Cannula and Simple Face Mask  Additional Equipment: None  Intra-op Plan:   Post-operative Plan:   Informed Consent: I have reviewed the patients History and Physical, chart, labs and discussed the procedure including the risks, benefits and alternatives for the proposed anesthesia with the patient or authorized representative who has indicated his/her  understanding and acceptance.   Dental advisory given  Plan Discussed with: CRNA and Surgeon  Anesthesia Plan Comments:         Anesthesia Quick Evaluation

## 2016-06-20 NOTE — Interval H&P Note (Signed)
History and Physical Interval Note:  06/20/2016 9:05 AM  [name] [name]  has presented today for surgery, with the diagnosis of right hip osteoarthritis  The various methods of treatment have been discussed with the patient and family. After consideration of risks, benefits and other options for treatment, the patient has consented to  Procedure(s): RIGHT TOTAL HIP ARTHROPLASTY ANTERIOR APPROACH (Right) as a surgical intervention .  The patient's history has been reviewed, patient examined, no change in status, stable for surgery.  I have reviewed the patient's chart and labs.  Questions were answered to the patient's satisfaction.     Mauri Pole

## 2016-06-20 NOTE — Evaluation (Signed)
Physical Therapy Evaluation Patient Details Name: [name] [name] MRN: [id number] DOB: [birth year] Today's Date: 06/20/2016   History of Present Illness  65 yo female s/p R THA-direct anterior 06/20/16.   Clinical Impression  On eval, pt required Min assist for mobility. She walked ~75 feet with RW. Pain rated 3/10 with activity. Will progress activity as tolerated.     Follow Up Recommendations No PT follow up;Supervision for mobility/OOB    Equipment Recommendations  None recommended by PT    Recommendations for Other Services       Precautions / Restrictions Precautions Precautions: Fall Restrictions Weight Bearing Restrictions: Yes RLE Weight Bearing: Weight bearing as tolerated      Mobility  Bed Mobility Overal bed mobility: Needs Assistance Bed Mobility: Supine to Sit     Supine to sit: Min assist     General bed mobility comments: Assist for R LE.   Transfers Overall transfer level: Needs assistance Equipment used: Rolling walker (2 wheeled) Transfers: Sit to/from Stand Sit to Stand: Min assist         General transfer comment: Assist to rise, stabilize, control descent. VCs safety, hand placement  Ambulation/Gait Ambulation/Gait assistance: Min assist Ambulation Distance (Feet): 75 Feet Assistive device: Rolling walker (2 wheeled) Gait Pattern/deviations: Step-through pattern;Decreased stride length     General Gait Details: Assist to stabilize. Pt tolerated distance well.  Stairs            Wheelchair Mobility    Modified Rankin (Stroke Patients Only)       Balance                                             Pertinent Vitals/Pain Pain Assessment: 0-10 Pain Score: 3  Pain Location: R hip/thigh Pain Descriptors / Indicators: Sore Pain Intervention(s): Monitored during session;Ice applied;Repositioned    Home Living Family/patient expects to be discharged to:: Private residence Living Arrangements:  Spouse/significant other   Type of Home: House Home Access: Stairs to enter Entrance Stairs-Rails: Left Entrance Stairs-Number of Steps: 8 Home Layout: One level Home Equipment: Walker - 2 wheels;Crutches;Cane - single point      Prior Function Level of Independence: Independent               Hand Dominance        Extremity/Trunk Assessment   Upper Extremity Assessment: Overall WFL for tasks assessed           Lower Extremity Assessment: Generalized weakness. Increased IR observed bilaterally.       Cervical / Trunk Assessment: Normal  Communication   Communication: No difficulties  Cognition Arousal/Alertness: Awake/alert Behavior During Therapy: WFL for tasks assessed/performed Overall Cognitive Status: Within Functional Limits for tasks assessed                      General Comments      Exercises        Assessment/Plan    PT Assessment Patient needs continued PT services  PT Diagnosis Difficulty walking;Acute pain   PT Problem List Decreased strength;Decreased range of motion;Decreased activity tolerance;Decreased balance;Decreased mobility;Pain;Decreased knowledge of use of DME  PT Treatment Interventions DME instruction;Gait training;Stair training;Functional mobility training;Balance training;Therapeutic exercise;Therapeutic activities;Patient/family education   PT Goals (Current goals can be found in the Care Plan section) Acute Rehab PT Goals Patient Stated Goal: home PT Goal Formulation: With patient/family Time  For Goal Achievement: 07/04/16 Potential to Achieve Goals: Good    Frequency 7X/week   Barriers to discharge        Co-evaluation               End of Session Equipment Utilized During Treatment: Gait belt Activity Tolerance: Patient tolerated treatment well Patient left: in chair;with call bell/phone within reach;with family/visitor present           Time: [id number] PT Time Calculation (min) (ACUTE  ONLY): 20 min   Charges:   PT Evaluation $PT Eval Low Complexity: 1 Procedure     PT G Codes:        Weston Anna, MPT Pager: [phone]

## 2016-06-20 NOTE — Op Note (Signed)
NAME:  [name] [name] NO.: [id number]      MEDICAL RECORD NO.: [id number]      FACILITY:  [facility]      PHYSICIAN:  Paralee Cancel D  DATE OF BIRTH:  [birth year]     DATE OF PROCEDURE:  06/20/2016                                 OPERATIVE REPORT         PREOPERATIVE DIAGNOSIS: Right  hip osteoarthritis.      POSTOPERATIVE DIAGNOSIS:  Right hip osteoarthritis.  Status post left total hip replacement     PROCEDURE:  Right total hip replacement through an anterior approach   utilizing DePuy THR system, component size 78mm pinnacle cup, a size 32+4 neutral   Altrex liner, a size 2 Hi Tri Lock stem with a 32+1 delta ceramic   ball.      SURGEON:  Pietro Cassis. Alvan Dame, M.D.      ASSISTANT:  Danae Orleans, PA-C     ANESTHESIA:  Spinal.      SPECIMENS:  None.      COMPLICATIONS:  None.      BLOOD LOSS:  600 cc     DRAINS:  None.      INDICATION OF THE PROCEDURE:  [name] [name] is a 65 y.o. female who had   presented to office for evaluation of right hip pain.  Radiographs revealed   progressive degenerative changes with bone-on-bone   articulation to the  hip joint.  The patient had painful limited range of   motion significantly affecting their overall quality of life.  The patient was failing to    respond to conservative measures, and at this point was ready   to proceed with more definitive measures.  The patient has noted progressive   degenerative changes in his hip, progressive problems and dysfunction   with regarding the hip prior to surgery.  Consent was obtained for   benefit of pain relief.  Specific risk of infection, DVT, component   failure, dislocation, need for revision surgery, as well discussion of   the anterior versus posterior approach were reviewed.  Consent was   obtained for benefit of anterior pain relief through an anterior   approach.      PROCEDURE IN DETAIL:  The patient was brought to operative theater.    Once adequate anesthesia, preoperative antibiotics, 1gm of Vancomycin, 1 gm of Tranexamic Acid, and 10 mg of Decadron administered.   The patient was positioned supine on the OSI Hanna table.  Once adequate   padding of boney process was carried out, we had predraped out the hip, and  used fluoroscopy to confirm orientation of the pelvis and position.      The right hip was then prepped and draped from proximal iliac crest to   mid thigh with shower curtain technique.      Time-out was performed identifying the patient, planned procedure, and   extremity.     An incision was then made 2 cm distal and lateral to the   anterior superior iliac spine extending over the orientation of the   tensor fascia lata muscle and sharp dissection was carried down to the   fascia of the muscle and protractor placed in the soft tissues.  The fascia was then incised.  The muscle belly was identified and swept   laterally and retractor placed along the superior neck.  Following   cauterization of the circumflex vessels and removing some pericapsular   fat, a second cobra retractor was placed on the inferior neck.  A third   retractor was placed on the anterior acetabulum after elevating the   anterior rectus.  A L-capsulotomy was along the line of the   superior neck to the trochanteric fossa, then extended proximally and   distally.  Tag sutures were placed and the retractors were then placed   intracapsular.  We then identified the trochanteric fossa and   orientation of my neck cut, confirmed this radiographically   and then made a neck osteotomy with the femur on traction.  The femoral   head was removed without difficulty or complication.  Traction was let   off and retractors were placed posterior and anterior around the   acetabulum.      The labrum and foveal tissue were debrided.  I began reaming with a 97mm   reamer and reamed up to 78mm reamer with good bony bed preparation and a 97mm    cup was chosen.  The final 63mm Pinnacle cup was then impacted under fluoroscopy  to confirm the depth of penetration and orientation with respect to   abduction.  A screw was placed followed by the hole eliminator.  The final   32+4 neutral Altrex liner was impacted with good visualized rim fit.  The cup was positioned anatomically within the acetabular portion of the pelvis.      At this point, the femur was rolled at 80 degrees.  Further capsule was   released off the inferior aspect of the femoral neck.  I then   released the superior capsule proximally.  The hook was placed laterally   along the femur and elevated manually and held in position with the bed   hook.  The leg was then extended and adducted with the leg rolled to 100   degrees of external rotation.  Once the proximal femur was fully   exposed, I used a box osteotome to set orientation.  I then began   broaching with the starting chili pepper broach and passed this by hand and then broached up to 2 matching the contralateral hip.  With the 2 broach in place I chose a high offset neck and did trial reductions.  The offset was appropriate, leg lengths   appeared to be equal best matched to the other hip previously replaced confirmed radiographically.   Given these findings, I went ahead and dislocated the hip, repositioned all   retractors and positioned the right hip in the extended and abducted position.  The final 2 Hi Tri Lock stem was   chosen and it was impacted down to the level of neck cut.  Based on this   and the trial reduction, a 32+1 delta ceramic ball was chosen and   impacted onto a clean and dry trunnion, and the hip was reduced.  The   hip had been irrigated throughout the case again at this point.  I did   reapproximate the superior capsular leaflet to the anterior leaflet   using #1 Vicryl.  The fascia of the   tensor fascia lata muscle was then reapproximated using #1 Vicryl and #0 V-lock sutures.  The    remaining wound was closed with 2-0 Vicryl and running 4-0 Monocryl.  The hip was cleaned, dried, and dressed sterilely using Dermabond and   Aquacel dressing.  She was then brought   to recovery room in stable condition tolerating the procedure well.    Danae Orleans, PA-C was present for the entirety of the case involved from   preoperative positioning, perioperative retractor management, general   facilitation of the case, as well as primary wound closure as assistant.            Pietro Cassis Alvan Dame, M.D.        06/20/2016 11:51 AM

## 2016-06-20 NOTE — Anesthesia Postprocedure Evaluation (Signed)
Anesthesia Post Note  Patient: [name] [name]  Procedure(s) Performed: Procedure(s) (LRB): RIGHT TOTAL HIP ARTHROPLASTY ANTERIOR APPROACH (Right)  Patient location during evaluation: PACU Anesthesia Type: Spinal and MAC [name]: awake and alert Pain management: pain [name] Vital Signs Assessment: post-procedure vital signs reviewed and stable Respiratory status: spontaneous breathing and respiratory function stable Cardiovascular status: blood pressure returned to baseline and stable Postop Assessment: spinal receding Anesthetic complications: no    Last Vitals:  Vitals:   06/20/16 1520 06/20/16 1642  BP: (!) 107/50 (!) 112/53  Pulse: 71 62  Resp: 15 17  Temp: 36.6 C 36.6 C    Last Pain:  Vitals:   06/20/16 1642  TempSrc: Axillary  PainSc:                  Tiajuana Amass

## 2016-06-20 NOTE — Anesthesia Procedure Notes (Signed)
Spinal  Patient location during procedure: OR Staffing Anesthesiologist: Suzette Battiest Performed: anesthesiologist  Preanesthetic Checklist Completed: patient identified, site marked, surgical consent, pre-op evaluation, timeout performed, IV checked, risks and benefits discussed and monitors and equipment checked Spinal Block Patient position: sitting Prep: ChloraPrep Patient monitoring: heart rate, continuous pulse ox and blood pressure Location: L5-S1 Injection technique: single-shot Needle Needle type: Pencan  Needle gauge: 24 G Needle length: 9 cm Additional Notes Expiration date of kit checked and confirmed. Patient tolerated procedure well, without complications.

## 2016-06-20 NOTE — Transfer of Care (Signed)
Immediate Anesthesia Transfer of Care Note  Patient: [name] [name]  Procedure(s) Performed: Procedure(s): RIGHT TOTAL HIP ARTHROPLASTY ANTERIOR APPROACH (Right)  Patient Location: PACU  Anesthesia Type:Spinal  [name]: awake, alert  and oriented  Airway & Oxygen Therapy: Patient Spontanous Breathing and Patient connected to face mask oxygen  Post-op Assessment: Report given to RN and Post -op Vital signs reviewed and stable  Post vital signs: Reviewed and stable  Last Vitals:  Vitals:   06/20/16 0701  BP: (!) 140/93  Pulse: 87  Resp: 18  Temp: 37 C    Last Pain:  Vitals:   06/20/16 0751  TempSrc:   PainSc: 1          Complications: No apparent anesthesia complications

## 2016-06-21 LAB — CBC
HEMATOCRIT: 25.5 % — AB (ref 36.0–46.0)
HEMOGLOBIN: 8.6 g/dL — AB (ref 12.0–15.0)
MCH: 30.5 pg (ref 26.0–34.0)
MCHC: 33.7 g/dL (ref 30.0–36.0)
MCV: 90.4 fL (ref 78.0–100.0)
Platelets: 220 10*3/uL (ref 150–400)
RBC: 2.82 MIL/uL — ABNORMAL LOW (ref 3.87–5.11)
RDW: 12.7 % (ref 11.5–15.5)
WBC: 7.2 10*3/uL (ref 4.0–10.5)

## 2016-06-21 LAB — BASIC METABOLIC PANEL
ANION GAP: 4 — AB (ref 5–15)
BUN: 17 mg/dL (ref 6–20)
CALCIUM: 8.8 mg/dL — AB (ref 8.9–10.3)
CO2: 26 mmol/L (ref 22–32)
Chloride: 108 mmol/L (ref 101–111)
Creatinine, Ser: 0.68 mg/dL (ref 0.44–1.00)
Glucose, Bld: 120 mg/dL — ABNORMAL HIGH (ref 65–99)
Potassium: 4 mmol/L (ref 3.5–5.1)
SODIUM: 138 mmol/L (ref 135–145)

## 2016-06-21 MED ORDER — METHOCARBAMOL 500 MG PO TABS: 500.0000 mg | ORAL_TABLET | Freq: Four times a day (QID) | ORAL | 0 refills | Status: DC | PRN

## 2016-06-21 MED ORDER — ASPIRIN 81 MG PO CHEW: 81.0000 mg | CHEWABLE_TABLET | Freq: Two times a day (BID) | ORAL | 0 refills | Status: AC

## 2016-06-21 MED ORDER — POLYETHYLENE GLYCOL 3350 17 G PO PACK: 17.0000 g | PACK | Freq: Two times a day (BID) | ORAL | 0 refills | Status: DC

## 2016-06-21 MED ORDER — HYDROCODONE-ACETAMINOPHEN 7.5-325 MG PO TABS: 1.0000 | ORAL_TABLET | ORAL | 0 refills | Status: DC | PRN

## 2016-06-21 NOTE — Discharge Instructions (Signed)

## 2016-06-21 NOTE — Progress Notes (Addendum)
Physical Therapy Treatment Patient Details Name: [name] [name] MRN: [id number] DOB: [birth year] Today's Date: 06/21/2016    History of Present Illness 65 yo female s/p R THA-direct anterior 06/20/16.     PT Comments    Progressing well with mobility. Practiced/reviewed exercises, gait training, and stair training. Instructed pt to perform exercises 2x/day (she still has handout from previous surgical admission). Pt did not feel she needed OT eval this admission (paged OT) All education completed. Ready to d/c from PT standpoint.    Follow Up Recommendations  No PT follow up;Supervision for mobility/OOB     Equipment Recommendations  None recommended by PT    Recommendations for Other Services       Precautions / Restrictions Restrictions Weight Bearing Restrictions: No RLE Weight Bearing: Weight bearing as tolerated    Mobility  Bed Mobility                  Transfers Overall transfer level: Needs assistance Equipment used: Rolling walker (2 wheeled) Transfers: Sit to/from Stand Sit to Stand: Supervision         General transfer comment: for safety  Ambulation/Gait Ambulation/Gait assistance: Supervision Ambulation Distance (Feet): 150 Feet Assistive device: Rolling walker (2 wheeled) Gait Pattern/deviations: Step-through pattern     General Gait Details:  Pt tolerated distance well.   Stairs Stairs: Yes Stairs assistance: Min guard Stair Management: Step to pattern;Forwards;One rail Right;One rail Left Number of Stairs: 5 General stair comments: Practiced x1 using L rail only, and x 1 using R rail only. close guard for safety. VCs safety, technique, sequence.  Wheelchair Mobility    Modified Rankin (Stroke Patients Only)       Balance                                    Cognition Arousal/Alertness: Awake/alert Behavior During Therapy: WFL for tasks assessed/performed Overall Cognitive Status: Within Functional Limits for  tasks assessed                      Exercises Total Joint Exercises Quad Sets: AROM;Both;10 reps;Seated Hip ABduction/ADduction: AROM;Right;10 reps;Standing Long Arc Quad: AROM;Right;10 reps;Seated Knee Flexion: AROM;Right;10 reps;Standing Marching in Standing: AROM;Both;Standing;10 reps General Exercises - Lower Extremity Heel Raises: AROM;Both;10 reps;Standing    General Comments        Pertinent Vitals/Pain Pain Assessment: 0-10 Pain Score: 3  Pain Location: R hip/thigh Pain Descriptors / Indicators: Sore Pain Intervention(s): Monitored during session;Repositioned;Ice applied    Home Living                      Prior Function            PT Goals (current goals can now be found in the care plan section) Progress towards PT goals: Progressing toward goals    Frequency  7X/week    PT Plan Current plan remains appropriate    Co-evaluation             End of Session   Activity Tolerance: Patient tolerated treatment well Patient left: in chair;with call bell/phone within reach     Time: [id number] PT Time Calculation (min) (ACUTE ONLY): 13 min  Charges:  $Gait Training: 8-22 mins                    G Codes:      Weston Anna, MPT Pager: [phone]

## 2016-06-21 NOTE — Progress Notes (Signed)
     Subjective: 1 Day Post-Op Procedure(s) (LRB): RIGHT TOTAL HIP ARTHROPLASTY ANTERIOR APPROACH (Right)   Patient reports pain as mild, pain controlled. No events throughout the night.  Ready to be discharged home.  Objective:   VITALS:   Vitals:   06/21/16 0200 06/21/16 0542  BP: (!) 101/41 (!) 106/47  Pulse: 64 68  Resp: 16 16  Temp: 98.5 F (36.9 C) 99.1 F (37.3 C)    Dorsiflexion/Plantar flexion intact Incision: dressing C/D/I No cellulitis present Compartment soft  LABS  Recent Labs  06/21/16 0422  HGB 8.6*  HCT 25.5*  WBC 7.2  PLT 220     Recent Labs  06/21/16 0422  NA 138  K 4.0  BUN 17  CREATININE 0.68  GLUCOSE 120*     Assessment/Plan: 1 Day Post-Op Procedure(s) (LRB): RIGHT TOTAL HIP ARTHROPLASTY ANTERIOR APPROACH (Right) Foley cath d/c'ed Advance diet Up with therapy D/C IV fluids Discharge home with home health Follow up in 2 weeks at [facility]. Follow up with OLIN,Deitra Craine D in 2 weeks.  Contact information:  [facility] [address], [address] [place]   PAC  06/21/2016, 9:43 AM

## 2016-06-21 NOTE — Care Management Note (Signed)
Case Management Note  Patient Details  Name: [name] [name] MRN: [id number] Date of Birth: 1951-08-09  Subjective/Objective:                  RIGHT TOTAL HIP ARTHROPLASTY ANTERIOR APPROACH (Right)  Action/Plan: Discharge planning Expected Discharge Date: 06/21/16                 Expected Discharge Plan:  Home/Self Care  In-House Referral:     Discharge planning Services  CM Consult  Post Acute Care Choice:  NA Choice offered to:  Patient  DME Arranged:  N/A DME Agency:  NA  HH Arranged:  NA HH Agency:  NA  Status of Service:  Completed, signed off  If discussed at [place] of Stay Meetings, dates discussed:    Additional Comments: CM notes pt has DME from previous surgery; no HH services is the plan; confirmed with pt and RN.  No other CM needs were communicated. Dellie Catholic, RN 06/21/2016, 9:58 AM
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
## 2016-07-06 DIAGNOSIS — Z471 Aftercare following joint replacement surgery: Secondary | ICD-10-CM | POA: Diagnosis not present

## 2016-07-06 DIAGNOSIS — Z96642 Presence of left artificial hip joint: Secondary | ICD-10-CM | POA: Diagnosis not present

## 2016-08-09 ENCOUNTER — Ambulatory Visit: Payer: 59 | Admitting: Family Medicine

## 2016-08-10 DIAGNOSIS — Z96642 Presence of left artificial hip joint: Secondary | ICD-10-CM | POA: Diagnosis not present

## 2016-08-10 DIAGNOSIS — Z96641 Presence of right artificial hip joint: Secondary | ICD-10-CM | POA: Diagnosis not present

## 2016-08-28 ENCOUNTER — Ambulatory Visit: Payer: 59 | Admitting: Internal Medicine

## 2016-08-28 ENCOUNTER — Encounter: Payer: Self-pay | Admitting: Family Medicine

## 2016-08-28 ENCOUNTER — Ambulatory Visit (INDEPENDENT_AMBULATORY_CARE_PROVIDER_SITE_OTHER): Payer: 59 | Admitting: Family Medicine

## 2016-08-28 VITALS — BP 126/74 | HR 70 | Temp 98.1°F | Ht 64.0 in | Wt 141.2 lb

## 2016-08-28 DIAGNOSIS — Z2911 Encounter for prophylactic immunotherapy for respiratory syncytial virus (RSV): Secondary | ICD-10-CM | POA: Diagnosis not present

## 2016-08-28 DIAGNOSIS — Z23 Encounter for immunization: Secondary | ICD-10-CM

## 2016-08-28 DIAGNOSIS — Z1231 Encounter for screening mammogram for malignant neoplasm of breast: Secondary | ICD-10-CM | POA: Diagnosis not present

## 2016-08-28 DIAGNOSIS — Z13220 Encounter for screening for lipoid disorders: Secondary | ICD-10-CM | POA: Diagnosis not present

## 2016-08-28 DIAGNOSIS — Z130 Encounter for screening for diseases of the blood and blood-forming organs and certain disorders involving the immune mechanism: Secondary | ICD-10-CM | POA: Diagnosis not present

## 2016-08-28 DIAGNOSIS — Z0000 Encounter for general adult medical examination without abnormal findings: Secondary | ICD-10-CM | POA: Diagnosis not present

## 2016-08-28 DIAGNOSIS — Z1239 Encounter for other screening for malignant neoplasm of breast: Secondary | ICD-10-CM

## 2016-08-28 DIAGNOSIS — Z1329 Encounter for screening for other suspected endocrine disorder: Secondary | ICD-10-CM

## 2016-08-28 NOTE — Assessment & Plan Note (Addendum)
Patient overall doing well. Breast exam completed with no abnormalities. Mammogram will be ordered. Pelvic exam completed. No abnormalities on pelvic exam. Last Pap smear reviewed and she is not due until next year. Labs as ordered. Continue diet and exercise. Immunizations given per below.

## 2016-08-28 NOTE — Patient Instructions (Signed)
Nice to meet you. Please continue to increase your exercise following your hip replacements. We will get you set up for mammogram. Please set up a lab appointment for fasting lab work.

## 2016-08-28 NOTE — Progress Notes (Signed)
Pre visit review using our clinic review tool, if applicable. No additional management support is needed unless otherwise documented below in the visit note. 
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
## 2016-08-29 ENCOUNTER — Encounter: Payer: Self-pay | Admitting: Family Medicine

## 2016-08-30 ENCOUNTER — Other Ambulatory Visit (INDEPENDENT_AMBULATORY_CARE_PROVIDER_SITE_OTHER): Payer: 59

## 2016-08-30 DIAGNOSIS — Z1329 Encounter for screening for other suspected endocrine disorder: Secondary | ICD-10-CM

## 2016-08-30 DIAGNOSIS — Z130 Encounter for screening for diseases of the blood and blood-forming organs and certain disorders involving the immune mechanism: Secondary | ICD-10-CM | POA: Diagnosis not present

## 2016-08-30 DIAGNOSIS — Z13220 Encounter for screening for lipoid disorders: Secondary | ICD-10-CM | POA: Diagnosis not present

## 2016-08-30 LAB — CBC
HCT: 39.3 % (ref 36.0–46.0)
HEMOGLOBIN: 12.9 g/dL (ref 12.0–15.0)
MCHC: 32.8 g/dL (ref 30.0–36.0)
MCV: 85.2 fl (ref 78.0–100.0)
PLATELETS: 259 10*3/uL (ref 150.0–400.0)
RBC: 4.61 Mil/uL (ref 3.87–5.11)
RDW: 14 % (ref 11.5–15.5)
WBC: 4.4 10*3/uL (ref 4.0–10.5)

## 2016-08-30 LAB — COMPREHENSIVE METABOLIC PANEL
ALK PHOS: 86 U/L (ref 39–117)
ALT: 14 U/L (ref 0–35)
AST: 19 U/L (ref 0–37)
Albumin: 4.3 g/dL (ref 3.5–5.2)
BUN: 15 mg/dL (ref 6–23)
CALCIUM: 9.9 mg/dL (ref 8.4–10.5)
CO2: 30 meq/L (ref 19–32)
Chloride: 103 mEq/L (ref 96–112)
Creatinine, Ser: 0.87 mg/dL (ref 0.40–1.20)
GFR: 69.9 mL/min (ref >60.00)
GLUCOSE: 93 mg/dL (ref 70–99)
POTASSIUM: 4.6 meq/L (ref 3.5–5.1)
Sodium: 140 mEq/L (ref 135–145)
TOTAL PROTEIN: 7.2 g/dL (ref 6.0–8.3)
Total Bilirubin: 0.4 mg/dL (ref 0.2–1.2)

## 2016-08-30 LAB — TSH: TSH: 1.72 u[IU]/mL (ref 0.35–4.50)

## 2016-08-30 LAB — LIPID PANEL
CHOL/HDL RATIO: 3
Cholesterol: 203 mg/dL — ABNORMAL HIGH (ref 0–200)
HDL: 68.1 mg/dL (ref >39.00)
LDL Cholesterol: 117 mg/dL — ABNORMAL HIGH (ref 0–99)
NONHDL: 134.89
TRIGLYCERIDES: 89 mg/dL (ref 0.0–149.0)
VLDL: 17.8 mg/dL (ref 0.0–40.0)

## 2016-10-02 ENCOUNTER — Ambulatory Visit
Admission: RE | Admit: 2016-10-02 | Discharge: 2016-10-02 | Disposition: A | Discharge status: Home / Self Care | Payer: 59 | Source: Ambulatory Visit | Attending: Family Medicine | Admitting: Family Medicine

## 2016-10-02 DIAGNOSIS — R928 Other abnormal and inconclusive findings on diagnostic imaging of breast: Secondary | ICD-10-CM | POA: Diagnosis not present

## 2016-10-02 DIAGNOSIS — R921 Mammographic calcification found on diagnostic imaging of breast: Secondary | ICD-10-CM | POA: Insufficient documentation

## 2016-10-02 DIAGNOSIS — Z1239 Encounter for other screening for malignant neoplasm of breast: Secondary | ICD-10-CM

## 2017-06-08 DIAGNOSIS — M160 Bilateral primary osteoarthritis of hip: Secondary | ICD-10-CM | POA: Diagnosis not present

## 2017-06-08 DIAGNOSIS — Z96642 Presence of left artificial hip joint: Secondary | ICD-10-CM | POA: Diagnosis not present

## 2017-06-08 DIAGNOSIS — Z96641 Presence of right artificial hip joint: Secondary | ICD-10-CM | POA: Diagnosis not present

## 2017-06-08 DIAGNOSIS — M25552 Pain in left hip: Secondary | ICD-10-CM | POA: Diagnosis not present

## 2017-06-08 DIAGNOSIS — Z471 Aftercare following joint replacement surgery: Secondary | ICD-10-CM | POA: Diagnosis not present

## 2017-06-12 ENCOUNTER — Other Ambulatory Visit: Payer: Self-pay | Admitting: Orthopedic Surgery

## 2017-06-12 DIAGNOSIS — Z96642 Presence of left artificial hip joint: Secondary | ICD-10-CM

## 2017-06-18 ENCOUNTER — Encounter
Admission: RE | Admit: 2017-06-18 | Discharge: 2017-06-18 | Disposition: A | Discharge status: Home / Self Care | Payer: 59 | Source: Ambulatory Visit | Attending: Orthopedic Surgery | Admitting: Orthopedic Surgery

## 2017-06-18 DIAGNOSIS — Z471 Aftercare following joint replacement surgery: Secondary | ICD-10-CM | POA: Diagnosis not present

## 2017-06-18 DIAGNOSIS — Z96642 Presence of left artificial hip joint: Secondary | ICD-10-CM | POA: Insufficient documentation

## 2017-06-18 MED ORDER — TECHNETIUM TC 99M MEDRONATE IV KIT
25.0000 | PACK | Freq: Once | INTRAVENOUS | Status: AC | PRN
  Administered 2017-06-18: 21.91 via INTRAVENOUS

## 2017-06-27 DIAGNOSIS — Z471 Aftercare following joint replacement surgery: Secondary | ICD-10-CM | POA: Diagnosis not present

## 2017-06-27 DIAGNOSIS — Z96641 Presence of right artificial hip joint: Secondary | ICD-10-CM | POA: Diagnosis not present

## 2017-06-27 DIAGNOSIS — Z96642 Presence of left artificial hip joint: Secondary | ICD-10-CM | POA: Diagnosis not present

## 2017-08-23 DIAGNOSIS — L638 Other alopecia areata: Secondary | ICD-10-CM | POA: Diagnosis not present

## 2017-08-29 ENCOUNTER — Encounter: Payer: Self-pay | Admitting: Family Medicine

## 2017-08-29 ENCOUNTER — Ambulatory Visit (INDEPENDENT_AMBULATORY_CARE_PROVIDER_SITE_OTHER): Payer: 59 | Admitting: Family Medicine

## 2017-08-29 ENCOUNTER — Other Ambulatory Visit (HOSPITAL_COMMUNITY)
Admission: RE | Admit: 2017-08-29 | Discharge: 2017-08-29 | Disposition: A | Discharge status: Home / Self Care | Payer: 59 | Source: Ambulatory Visit | Attending: Family Medicine | Admitting: Family Medicine

## 2017-08-29 VITALS — BP 120/70 | HR 77 | Temp 98.2°F | Ht 62.0 in | Wt 142.2 lb

## 2017-08-29 DIAGNOSIS — Z124 Encounter for screening for malignant neoplasm of cervix: Secondary | ICD-10-CM | POA: Insufficient documentation

## 2017-08-29 DIAGNOSIS — Z0000 Encounter for general adult medical examination without abnormal findings: Secondary | ICD-10-CM | POA: Diagnosis not present

## 2017-08-29 DIAGNOSIS — Z1231 Encounter for screening mammogram for malignant neoplasm of breast: Secondary | ICD-10-CM | POA: Diagnosis not present

## 2017-08-29 DIAGNOSIS — E663 Overweight: Secondary | ICD-10-CM

## 2017-08-29 DIAGNOSIS — Z1329 Encounter for screening for other suspected endocrine disorder: Secondary | ICD-10-CM

## 2017-08-29 DIAGNOSIS — Z13220 Encounter for screening for lipoid disorders: Secondary | ICD-10-CM

## 2017-08-29 DIAGNOSIS — Z13820 Encounter for screening for osteoporosis: Secondary | ICD-10-CM

## 2017-08-29 DIAGNOSIS — Z130 Encounter for screening for diseases of the blood and blood-forming organs and certain disorders involving the immune mechanism: Secondary | ICD-10-CM

## 2017-08-29 DIAGNOSIS — Z1239 Encounter for other screening for malignant neoplasm of breast: Secondary | ICD-10-CM

## 2017-08-29 NOTE — Patient Instructions (Signed)
Nice to see you. We will contact you with her Pap smear results. We will call when her lab work returns. Please continue to monitor diet and exercise.
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
## 2017-08-29 NOTE — Assessment & Plan Note (Signed)
Physical exam completed. Mammogram ordered. Bone density testing ordered. Pap smear completed. Immunizations up-to-date. Advised on diet and exercise. Labs as ordered below.

## 2017-08-31 LAB — CYTOLOGY - PAP
Diagnosis: NEGATIVE
HPV: NOT DETECTED

## 2017-09-14 ENCOUNTER — Telehealth: Payer: Self-pay | Admitting: Family Medicine

## 2017-09-14 DIAGNOSIS — Z87898 Personal history of other specified conditions: Secondary | ICD-10-CM

## 2017-09-14 NOTE — Telephone Encounter (Signed)
Thank you :)

## 2017-09-14 NOTE — Telephone Encounter (Signed)
Per [place] Poli pt was supposed to have went back to get a diagnostic mammo left breast calcification fu Mammo Img 5535 Left Img 5531 Right Img 5532 Order needed please and thank you!

## 2017-09-14 NOTE — Telephone Encounter (Signed)
Orders placed.

## 2017-09-14 NOTE — Telephone Encounter (Signed)
Please advise 

## 2017-09-20 ENCOUNTER — Other Ambulatory Visit (INDEPENDENT_AMBULATORY_CARE_PROVIDER_SITE_OTHER): Payer: 59

## 2017-09-20 DIAGNOSIS — E663 Overweight: Secondary | ICD-10-CM | POA: Diagnosis not present

## 2017-09-20 DIAGNOSIS — Z1329 Encounter for screening for other suspected endocrine disorder: Secondary | ICD-10-CM | POA: Diagnosis not present

## 2017-09-20 DIAGNOSIS — Z13220 Encounter for screening for lipoid disorders: Secondary | ICD-10-CM | POA: Diagnosis not present

## 2017-09-20 DIAGNOSIS — Z130 Encounter for screening for diseases of the blood and blood-forming organs and certain disorders involving the immune mechanism: Secondary | ICD-10-CM

## 2017-09-20 LAB — LIPID PANEL
CHOL/HDL RATIO: 3
CHOLESTEROL: 212 mg/dL — AB (ref 0–200)
HDL: 70.8 mg/dL (ref >39.00)
LDL Cholesterol: 128 mg/dL — ABNORMAL HIGH (ref 0–99)
NonHDL: 141.64
TRIGLYCERIDES: 67 mg/dL (ref 0.0–149.0)
VLDL: 13.4 mg/dL (ref 0.0–40.0)

## 2017-09-20 LAB — CBC
HCT: 39.8 % (ref 36.0–46.0)
Hemoglobin: 13 g/dL (ref 12.0–15.0)
MCHC: 32.7 g/dL (ref 30.0–36.0)
MCV: 87.3 fl (ref 78.0–100.0)
Platelets: 263 10*3/uL (ref 150.0–400.0)
RBC: 4.56 Mil/uL (ref 3.87–5.11)
RDW: 13.8 % (ref 11.5–15.5)
WBC: 3.9 10*3/uL — AB (ref 4.0–10.5)

## 2017-09-20 LAB — COMPREHENSIVE METABOLIC PANEL
ALBUMIN: 4.5 g/dL (ref 3.5–5.2)
ALT: 15 U/L (ref 0–35)
AST: 21 U/L (ref 0–37)
Alkaline Phosphatase: 61 U/L (ref 39–117)
BUN: 18 mg/dL (ref 6–23)
CHLORIDE: 101 meq/L (ref 96–112)
CO2: 30 mEq/L (ref 19–32)
CREATININE: 0.88 mg/dL (ref 0.40–1.20)
Calcium: 10.3 mg/dL (ref 8.4–10.5)
GFR: 68.75 mL/min (ref >60.00)
GLUCOSE: 95 mg/dL (ref 70–99)
Potassium: 4.5 mEq/L (ref 3.5–5.1)
SODIUM: 138 meq/L (ref 135–145)
TOTAL PROTEIN: 7.3 g/dL (ref 6.0–8.3)
Total Bilirubin: 0.6 mg/dL (ref 0.2–1.2)

## 2017-09-20 LAB — TSH: TSH: 2.25 u[IU]/mL (ref 0.35–4.50)

## 2017-09-20 LAB — HEMOGLOBIN A1C: Hgb A1c MFr Bld: 5.6 % (ref 4.6–6.5)

## 2017-09-20 NOTE — Progress Notes (Signed)
The 10-year ASCVD risk score Mikey Bussing DC Brooke Bonito., et al., 2013) is: 4.1%   Values used to calculate the score:     Age: 66 years     Sex: Female     Is Non-Hispanic African American: No     Diabetic: No     Tobacco smoker: No     Systolic Blood Pressure: 102 mmHg     Is BP treated: No     HDL Cholesterol: 70.8 mg/dL     Total Cholesterol: 212 mg/dL

## 2017-09-24 ENCOUNTER — Telehealth: Payer: Self-pay | Admitting: *Deleted

## 2017-09-24 NOTE — Telephone Encounter (Signed)
Patient notified

## 2017-09-24 NOTE — Telephone Encounter (Signed)
Copied from [place] [id number]. Topic: Inquiry >> Sep 24, 2017  2:16 PM Conception Chancy, NT wrote: Reason for CRM: pt states she received a call to discuss lab results with Dr. Caryl Bis, she is calling to go over them. p

## 2017-10-18 ENCOUNTER — Other Ambulatory Visit: Payer: 59

## 2017-10-25 ENCOUNTER — Encounter: Payer: Self-pay | Admitting: Family Medicine

## 2017-10-26 ENCOUNTER — Other Ambulatory Visit: Payer: Self-pay | Admitting: Family Medicine

## 2017-10-26 DIAGNOSIS — L639 Alopecia areata, unspecified: Secondary | ICD-10-CM

## 2017-11-28 ENCOUNTER — Ambulatory Visit
Admission: RE | Admit: 2017-11-28 | Discharge: 2017-11-28 | Disposition: A | Discharge status: Home / Self Care | Payer: 59 | Source: Ambulatory Visit | Attending: Family Medicine | Admitting: Family Medicine

## 2017-11-28 DIAGNOSIS — Z87898 Personal history of other specified conditions: Secondary | ICD-10-CM | POA: Diagnosis not present

## 2017-11-28 DIAGNOSIS — Z13820 Encounter for screening for osteoporosis: Secondary | ICD-10-CM | POA: Diagnosis not present

## 2017-11-28 DIAGNOSIS — R921 Mammographic calcification found on diagnostic imaging of breast: Secondary | ICD-10-CM | POA: Insufficient documentation

## 2017-11-28 DIAGNOSIS — Z780 Asymptomatic menopausal state: Secondary | ICD-10-CM | POA: Diagnosis not present

## 2017-11-29 DIAGNOSIS — L638 Other alopecia areata: Secondary | ICD-10-CM | POA: Diagnosis not present

## 2017-12-19 ENCOUNTER — Telehealth: Payer: Self-pay | Admitting: *Deleted

## 2017-12-19 NOTE — Telephone Encounter (Signed)
Copied from [place]. Topic: General - Other >> Dec 19, 2017 10:03 AM Ivar Drape wrote: Reason for CRM:  Patient has a physical scheduled for 08/30/18 at 3:30pm, but she stated that her insurance company [facility]) require her physicals to be done between Jan 1st and July 1st.  The patient's last physical was done on 08/29/17.  Please advise.

## 2017-12-19 NOTE — Telephone Encounter (Signed)
If that is what the patient states her insurance carrier says then schedule the patient. That is the patients responsibility to call her insurance carrier to verify.

## 2017-12-19 NOTE — Telephone Encounter (Signed)
Patient has been rescheduled.

## 2017-12-19 NOTE — Telephone Encounter (Signed)
Noted.  If her insurance mandates that is when she has a physical you can change it to sometime in June.  Thanks.

## 2017-12-19 NOTE — Telephone Encounter (Signed)
Will this be covered if we move it up?

## 2017-12-19 NOTE — Telephone Encounter (Signed)
Please advise 

## 2018-02-05 ENCOUNTER — Encounter: Payer: Self-pay | Admitting: Family Medicine

## 2018-02-05 NOTE — Telephone Encounter (Signed)
Can you please check on this?  It appears that she had a diagnostic mammogram done in 2017 that recommended a follow-up diagnostic bilateral mammogram to be done at 12 months.  This is what was ordered and completed.  It appears she is going to get a bill for this.  I have already told her that things were ordered appropriately that we would check into it.  Thanks.

## 2018-02-06 NOTE — Telephone Encounter (Signed)
Your information was sent to our coders for review. According to there findings your exam was coded correctly. The recommendations from your last mammogram was for a Bilateral diagnostic mammography in 12 months with magnification views of the left breast which will demonstrate 2 years of stability of the probably benign left breast calcifications. Therefore your mammogram could not be coded as a screening. However, going forward you can have regular screenings if nothing suspicious is found.

## 2018-02-12 IMAGING — DX DG HIP (WITH OR WITHOUT PELVIS) 1V PORT*L*
2 series · 2 of 2 positions shown · non-contrast
Comparison: Plain films left hip 12/28/2015.

CLINICAL DATA: Status post left hip replacement today.

EXAM:
DG HIP (WITH OR WITHOUT PELVIS) 1V PORT LEFT

[pelvis ap]
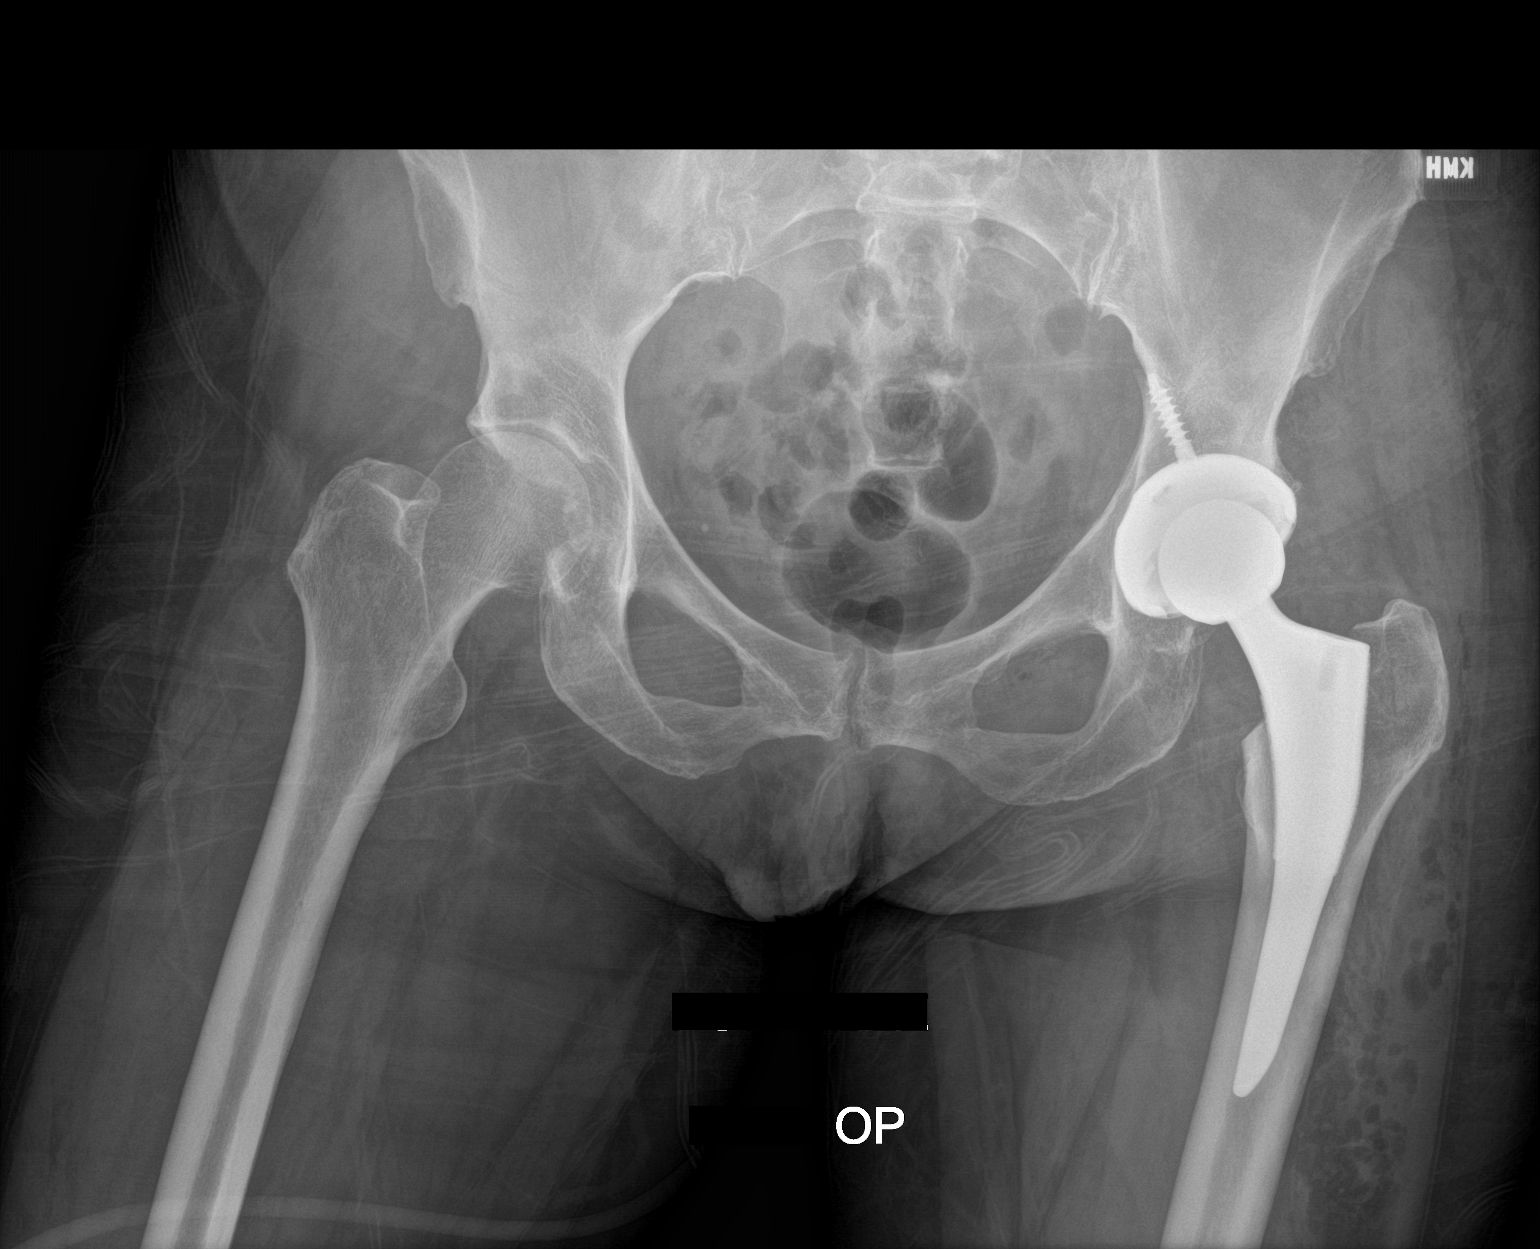

[hip lat]
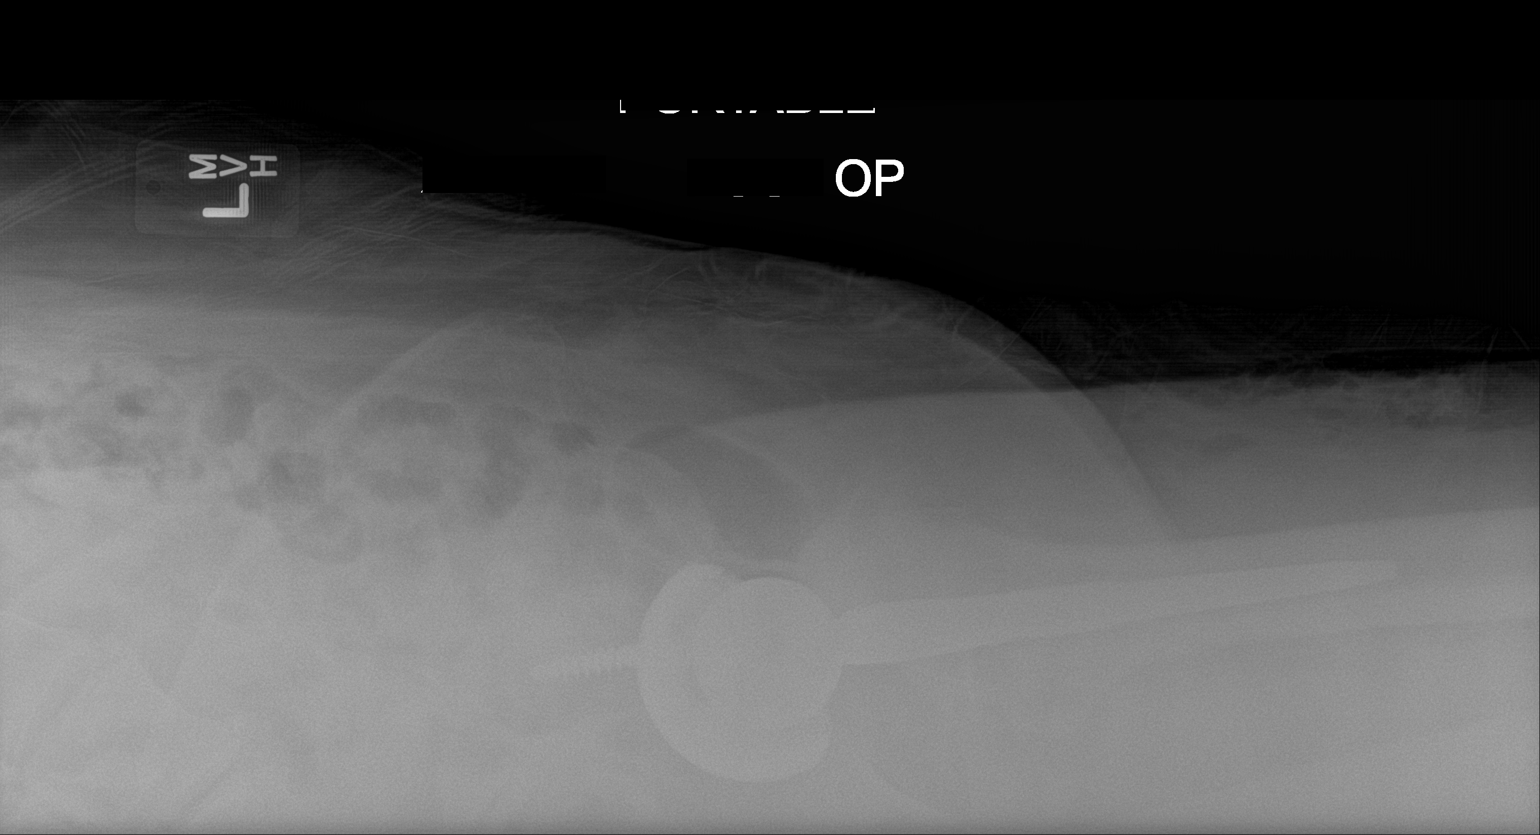

[2 of 2 positions shown; findings below may reference images not displayed]

FINDINGS: A new left hip arthroplasty is in place. The device is located.
Screw up anchoring the acetabular cup results in a nondisplaced
fracture through the the iliopectineal line. No other fracture is
identified. Gas in the soft tissues from surgery is noted.
IMPRESSION: Status post left hip replacement. There is a small nondisplaced
fracture at the left iliopectineal line due to an anchoring screw in
the acetabular cup.

## 2018-02-27 DIAGNOSIS — L638 Other alopecia areata: Secondary | ICD-10-CM | POA: Diagnosis not present

## 2018-04-01 ENCOUNTER — Encounter: Payer: 59 | Admitting: Family Medicine

## 2018-05-29 DIAGNOSIS — L638 Other alopecia areata: Secondary | ICD-10-CM | POA: Diagnosis not present

## 2018-07-05 ENCOUNTER — Encounter: Payer: 59 | Admitting: Family Medicine

## 2018-07-17 DIAGNOSIS — L821 Other seborrheic keratosis: Secondary | ICD-10-CM | POA: Diagnosis not present

## 2018-07-17 DIAGNOSIS — L638 Other alopecia areata: Secondary | ICD-10-CM | POA: Diagnosis not present

## 2018-08-30 ENCOUNTER — Ambulatory Visit (INDEPENDENT_AMBULATORY_CARE_PROVIDER_SITE_OTHER): Payer: 59 | Admitting: Family Medicine

## 2018-08-30 ENCOUNTER — Encounter: Payer: Self-pay | Admitting: Family Medicine

## 2018-08-30 ENCOUNTER — Encounter: Payer: 59 | Admitting: Family Medicine

## 2018-08-30 VITALS — BP 120/68 | HR 74 | Temp 97.8°F | Ht 62.0 in | Wt 143.0 lb

## 2018-08-30 DIAGNOSIS — Z13220 Encounter for screening for lipoid disorders: Secondary | ICD-10-CM

## 2018-08-30 DIAGNOSIS — E663 Overweight: Secondary | ICD-10-CM

## 2018-08-30 DIAGNOSIS — M2550 Pain in unspecified joint: Secondary | ICD-10-CM

## 2018-08-30 DIAGNOSIS — Z1329 Encounter for screening for other suspected endocrine disorder: Secondary | ICD-10-CM | POA: Diagnosis not present

## 2018-08-30 DIAGNOSIS — Z0001 Encounter for general adult medical examination with abnormal findings: Secondary | ICD-10-CM | POA: Diagnosis not present

## 2018-08-30 DIAGNOSIS — Z130 Encounter for screening for diseases of the blood and blood-forming organs and certain disorders involving the immune mechanism: Secondary | ICD-10-CM

## 2018-08-30 NOTE — Assessment & Plan Note (Signed)
Physical exam completed.  Encouraged continued exercise and monitoring her diet.  Will request colonoscopy records.  Lab work as outlined below.  She will see her ophthalmologist to discuss her eyelid twitching.

## 2018-08-30 NOTE — Progress Notes (Signed)
[name], MD Phone: [phone]  [name] [name] is a 67 y.o. female who presents today for CPE.  Exercise: Does elliptical and weights 1-2 times a week.  Does yoga twice a week. Diet: Lots of fruits and vegetables.  Otherwise regular diet.  Not much soda.  No sweet tea. Mammogram up-to-date 11/28/2017.  This is negative. Colonoscopy up-to-date.  Previously done at [facility].  Will request records. Pap smear 08/29/2017 negative for cells and HPV. No family history of breast cancer, colon cancer, or ovarian cancer. Tetanus vaccination and flu vaccination up-to-date. She has had hepatitis C and HIV screening with needlesticks previously. No tobacco use or illicit drug use.  Occasional wine. Sees a dentist twice yearly.  She is due to see an ophthalmologist. She reports intermittent right lower eyelid twitching that has been going on for several weeks off and on.  No vision changes. She does note scattered arthralgias particularly in her knees and hands.  These have been going on for some time.  She wonders about lab testing for inflammatory arthritis.  Active Ambulatory Problems    Diagnosis Date Noted  . Alopecia areata 07/24/2011  . Encounter for general adult medical examination with abnormal findings 08/12/2013  . Arthralgia 08/27/2015  . Left hip pain 12/28/2015  . Piriformis syndrome of left side 12/28/2015  . Arthritis of left hip 12/28/2015  . Preoperative clearance 05/08/2016  . S/P right THA, AA 06/20/2016   Resolved Ambulatory Problems    Diagnosis Date Noted  . General medical exam 07/29/2012  . Menopausal disorder 07/29/2012  . S/P left THA, AA 05/23/2016   Past Medical History:  Diagnosis Date  . Alopecia   . Arthritis   . C3 cervical fracture (HCC)     Family History  Problem Relation Age of Onset  . Cancer Mother        lung ca, died in 1s  . Heart disease Father   . Breast cancer Neg Hx     Social History   Socioeconomic History  .  Marital status: Married    Spouse name: Not on file  . Number of children: Not on file  . Years of education: Not on file  . Highest education level: Not on file  Occupational History  . Not on file  Social Needs  . Financial resource strain: Not on file  . Food insecurity:    Worry: Not on file    Inability: Not on file  . Transportation needs:    Medical: Not on file    Non-medical: Not on file  Tobacco Use  . Smoking status: Never Smoker  . Smokeless tobacco: Never Used  Substance and Sexual Activity  . Alcohol use: Yes    Alcohol/week: 0.0 standard drinks    Comment: glass of wine every month or two  . Drug use: No  . Sexual activity: Yes    Partners: Male  Lifestyle  . Physical activity:    Days per week: Not on file    Minutes per session: Not on file  . Stress: Not on file  Relationships  . Social connections:    Talks on phone: Not on file    Gets together: Not on file    Attends religious service: Not on file    Active member of club or organization: Not on file    Attends meetings of clubs or organizations: Not on file    Relationship status: Not on file  . Intimate partner violence:  Fear of current or ex partner: Not on file    Emotionally abused: Not on file    Physically abused: Not on file    Forced sexual activity: Not on file  Other Topics Concern  . Not on file  Social History Narrative  . Not on file    ROS  General:  Negative for nexplained weight loss, fever Skin: Negative for new or changing mole, sore that won't heal HEENT: Negative for trouble hearing, trouble seeing, ringing in ears, mouth sores, hoarseness, change in voice, dysphagia. CV:  Negative for chest pain, dyspnea, edema, palpitations Resp: Negative for cough, dyspnea, hemoptysis GI: Negative for nausea, vomiting, diarrhea, constipation, abdominal pain, melena, hematochezia. GU: Negative for dysuria, incontinence, urinary hesitance, hematuria, vaginal or penile discharge,  polyuria, sexual difficulty, lumps in testicle or breasts MSK: Negative for muscle cramps or aches, positive for joint pain or swelling Neuro: Negative for headaches, weakness, numbness, dizziness, passing out/fainting Psych: Negative for depression, anxiety, memory problems  Objective  Physical Exam Vitals:   08/30/18 1603  BP: 120/68  Pulse: 74  Temp: 97.8 F (36.6 C)  SpO2: 98%    BP Readings from Last 3 Encounters:  08/30/18 120/68  08/29/17 120/70  08/28/16 126/74   Wt Readings from Last 3 Encounters:  08/30/18 143 lb (64.9 kg)  08/29/17 142 lb 3.2 oz (64.5 kg)  08/28/16 141 lb 3.2 oz (64 kg)    Physical Exam  Constitutional: No distress.  HENT:  Head: Normocephalic and atraumatic.  Mouth/Throat: Oropharynx is clear and moist.  Eyes: Pupils are equal, round, and reactive to light. Conjunctivae are normal.  Neck: Neck supple.  Cardiovascular: Normal rate, regular rhythm and normal heart sounds.  Pulmonary/Chest: Effort normal and breath sounds normal.  Abdominal: Soft. Bowel sounds are normal. She exhibits no distension. There is no tenderness. There is no rebound and no guarding.  Musculoskeletal: She exhibits no edema.  Bilateral knees with no swelling, warmth, erythema, or tenderness, negative McMurray's bilaterally, slight tenderness over bilateral carpometacarpal joints, no other tenderness in her hands, no apparent joint swelling  Lymphadenopathy:    She has no cervical adenopathy.  Neurological: She is alert.  Skin: Skin is warm and dry. She is not diaphoretic.  Psychiatric: She has a normal mood and affect.     Assessment/Plan:   Encounter for general adult medical examination with abnormal findings Physical exam completed.  Encouraged continued exercise and monitoring her diet.  Will request colonoscopy records.  Lab work as outlined below.  She will see her ophthalmologist to discuss her eyelid twitching.  Arthralgia Scattered arthralgias.  Suspect  osteoarthritis though we will check inflammatory arthritic labs as well.   Orders Placed This Encounter  Procedures  . CBC  . Comp Met (CMET)  . TSH  . HgB A1c  . Lipid panel  . Rheumatoid Factor  . Cyclic citrul peptide antibody, IgG ([facility])  . Antinuclear Antib (ANA)  . Sedimentation rate    No orders of the defined types were placed in this encounter.    [name], MD [place]

## 2018-08-30 NOTE — Assessment & Plan Note (Signed)
Scattered arthralgias.  Suspect osteoarthritis though we will check inflammatory arthritic labs as well.

## 2018-08-30 NOTE — Patient Instructions (Signed)
Nice to see you. We will check lab work today and contact you with the results. Please see your eye doctor as planned.

## 2018-08-31 ENCOUNTER — Other Ambulatory Visit: Payer: Self-pay | Admitting: Family Medicine

## 2018-08-31 DIAGNOSIS — R718 Other abnormality of red blood cells: Secondary | ICD-10-CM

## 2018-09-02 LAB — COMPREHENSIVE METABOLIC PANEL
AG Ratio: 1.6 (calc) (ref 1.0–2.5)
ALBUMIN MSPROF: 4.2 g/dL (ref 3.6–5.1)
ALT: 18 U/L (ref 6–29)
AST: 27 U/L (ref 10–35)
Alkaline phosphatase (APISO): 65 U/L (ref 33–130)
BUN: 18 mg/dL (ref 7–25)
CALCIUM: 9.8 mg/dL (ref 8.6–10.4)
CO2: 24 mmol/L (ref 20–32)
CREATININE: 0.8 mg/dL (ref 0.50–0.99)
Chloride: 103 mmol/L (ref 98–110)
Globulin: 2.7 g/dL (calc) (ref 1.9–3.7)
Glucose, Bld: 82 mg/dL (ref 65–99)
POTASSIUM: 4 mmol/L (ref 3.5–5.3)
SODIUM: 141 mmol/L (ref 135–146)
TOTAL PROTEIN: 6.9 g/dL (ref 6.1–8.1)
Total Bilirubin: 0.4 mg/dL (ref 0.2–1.2)

## 2018-09-02 LAB — CBC
HCT: 37.7 % (ref 35.0–45.0)
Hemoglobin: 12.2 g/dL (ref 11.7–15.5)
MCH: 24.6 pg — AB (ref 27.0–33.0)
MCHC: 32.4 g/dL (ref 32.0–36.0)
MCV: 76 fL — AB (ref 80.0–100.0)
MPV: 10.9 fL (ref 7.5–12.5)
PLATELETS: 229 10*3/uL (ref 140–400)
RBC: 4.96 10*6/uL (ref 3.80–5.10)
RDW: 18.5 % — ABNORMAL HIGH (ref 11.0–15.0)
WBC: 4.8 10*3/uL (ref 3.8–10.8)

## 2018-09-02 LAB — HEMOGLOBIN A1C
HEMOGLOBIN A1C: 5.6 %{Hb} (ref <5.7)
Mean Plasma Glucose: 114 (calc)
eAG (mmol/L): 6.3 (calc)

## 2018-09-02 LAB — LIPID PANEL
CHOL/HDL RATIO: 3.1 (calc) (ref <5.0)
CHOLESTEROL: 212 mg/dL — AB (ref <200)
HDL: 69 mg/dL (ref >50)
LDL CHOLESTEROL (CALC): 118 mg/dL — AB
NON-HDL CHOLESTEROL (CALC): 143 mg/dL — AB (ref <130)
Triglycerides: 136 mg/dL (ref <150)

## 2018-09-02 LAB — CYCLIC CITRUL PEPTIDE ANTIBODY, IGG

## 2018-09-02 LAB — TSH: TSH: 2.54 m[IU]/L (ref 0.40–4.50)

## 2018-09-02 LAB — RHEUMATOID FACTOR: Rhuematoid fact SerPl-aCnc: <14 IU/mL (ref <14)

## 2018-09-02 LAB — ANA: ANA: NEGATIVE

## 2018-09-02 LAB — SEDIMENTATION RATE: Sed Rate: 11 mm/h (ref 0–30)

## 2018-09-06 ENCOUNTER — Other Ambulatory Visit (INDEPENDENT_AMBULATORY_CARE_PROVIDER_SITE_OTHER): Payer: 59

## 2018-09-06 DIAGNOSIS — R718 Other abnormality of red blood cells: Secondary | ICD-10-CM

## 2018-09-06 NOTE — Addendum Note (Signed)
Addended by: Arby Barrette on: 09/06/2018 03:35 PM   Modules accepted: Orders

## 2018-09-07 LAB — CBC
HEMATOCRIT: 39.5 % (ref 35.0–45.0)
Hemoglobin: 12.7 g/dL (ref 11.7–15.5)
MCH: 24.9 pg — ABNORMAL LOW (ref 27.0–33.0)
MCHC: 32.2 g/dL (ref 32.0–36.0)
MCV: 77.5 fL — ABNORMAL LOW (ref 80.0–100.0)
MPV: 11.3 fL (ref 7.5–12.5)
Platelets: 240 10*3/uL (ref 140–400)
RBC: 5.1 10*6/uL (ref 3.80–5.10)
RDW: 18.7 % — ABNORMAL HIGH (ref 11.0–15.0)
WBC: 4.7 10*3/uL (ref 3.8–10.8)

## 2018-09-07 LAB — IRON,TIBC AND FERRITIN PANEL
%SAT: 11 % — AB (ref 16–45)
Ferritin: 34 ng/mL (ref 16–288)
IRON: 52 ug/dL (ref 45–160)
TIBC: 487 ug/dL — AB (ref 250–450)

## 2018-10-17 DIAGNOSIS — H2513 Age-related nuclear cataract, bilateral: Secondary | ICD-10-CM | POA: Diagnosis not present

## 2019-06-03 ENCOUNTER — Encounter: Payer: Self-pay | Admitting: Family Medicine

## 2019-06-03 DIAGNOSIS — L639 Alopecia areata, unspecified: Secondary | ICD-10-CM

## 2019-06-10 NOTE — Addendum Note (Signed)
Addended by: Leone Haven on: 06/10/2019 04:34 PM   Modules accepted: Orders

## 2019-06-11 ENCOUNTER — Telehealth: Payer: Self-pay | Admitting: Family Medicine

## 2019-07-30 NOTE — Telephone Encounter (Signed)
error 

## 2019-08-19 ENCOUNTER — Encounter: Payer: Self-pay | Admitting: Family Medicine

## 2019-08-19 DIAGNOSIS — L639 Alopecia areata, unspecified: Secondary | ICD-10-CM

## 2019-09-01 ENCOUNTER — Encounter: Payer: 59 | Admitting: Family Medicine

## 2019-09-12 ENCOUNTER — Encounter: Payer: Self-pay | Admitting: Family Medicine

## 2019-09-17 ENCOUNTER — Telehealth: Payer: Self-pay | Admitting: *Deleted

## 2019-09-17 NOTE — Telephone Encounter (Signed)
Copied from [place] [phone]. Topic: General - Other >> Sep 17, 2019  3:30 PM Virl Axe D wrote: Reason for CRM: Lovey Newcomer with [facility] stated pt declined appt due to appointment being far out. Please advise

## 2019-09-30 NOTE — Telephone Encounter (Signed)
I called to inform the patient that the referral was sent to [facility] instead to get her in faster, patient declined and stated she wanted the [facility] basically or nothing, so she does not want the referral anywhere else.  Jeimy Bickert,cma

## 2019-09-30 NOTE — Telephone Encounter (Signed)
I have sent it to [facility] here in [place]

## 2019-09-30 NOTE — Telephone Encounter (Signed)
Dannette Barbara with [facility] stated pt declined appt due to appointment being far out. Can you find a dermatologist that she can see sooner the referral is there or should I call the patient and inform her to find her own.   Please advise Haron Beilke,cma

## 2019-11-04 ENCOUNTER — Other Ambulatory Visit: Payer: Self-pay

## 2019-11-04 ENCOUNTER — Encounter: Payer: Self-pay | Admitting: Family Medicine

## 2019-11-04 ENCOUNTER — Ambulatory Visit (INDEPENDENT_AMBULATORY_CARE_PROVIDER_SITE_OTHER): Payer: No Typology Code available for payment source | Admitting: Family Medicine

## 2019-11-04 VITALS — Wt 139.0 lb

## 2019-11-04 DIAGNOSIS — Z0000 Encounter for general adult medical examination without abnormal findings: Secondary | ICD-10-CM | POA: Diagnosis not present

## 2019-11-04 DIAGNOSIS — D649 Anemia, unspecified: Secondary | ICD-10-CM | POA: Diagnosis not present

## 2019-11-04 DIAGNOSIS — Z13220 Encounter for screening for lipoid disorders: Secondary | ICD-10-CM | POA: Diagnosis not present

## 2019-11-04 DIAGNOSIS — Z1231 Encounter for screening mammogram for malignant neoplasm of breast: Secondary | ICD-10-CM | POA: Diagnosis not present
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
## 2019-11-04 NOTE — Assessment & Plan Note (Addendum)
Physical exam was completed.  Encouraged continued diet and exercise.  She has deferred colonoscopy given the COVID-19 pandemic.  She has no personal history of polyps or family history.  I believe this is reasonable to defer for a short period of time given the pandemic and risk of Covid.  She will let us know when she is ready to schedule this.  No further Pap smears needed.  Mammogram ordered and she knows to call to schedule.  She will come in in early February for Pneumovax and Shingrix as well as lab work.  Labs as outlined below.

## 2019-11-05 ENCOUNTER — Encounter: Payer: Self-pay | Admitting: Family Medicine

## 2019-11-18 ENCOUNTER — Ambulatory Visit
Admission: RE | Admit: 2019-11-18 | Discharge: 2019-11-18 | Disposition: A | Discharge status: Home / Self Care | Payer: No Typology Code available for payment source | Source: Ambulatory Visit | Attending: Family Medicine | Admitting: Family Medicine

## 2019-11-18 DIAGNOSIS — Z1231 Encounter for screening mammogram for malignant neoplasm of breast: Secondary | ICD-10-CM | POA: Insufficient documentation

## 2019-12-17 ENCOUNTER — Other Ambulatory Visit: Payer: Self-pay

## 2019-12-17 ENCOUNTER — Ambulatory Visit (INDEPENDENT_AMBULATORY_CARE_PROVIDER_SITE_OTHER): Payer: No Typology Code available for payment source | Admitting: *Deleted

## 2019-12-17 ENCOUNTER — Other Ambulatory Visit (INDEPENDENT_AMBULATORY_CARE_PROVIDER_SITE_OTHER): Payer: No Typology Code available for payment source

## 2019-12-17 DIAGNOSIS — D649 Anemia, unspecified: Secondary | ICD-10-CM | POA: Diagnosis not present

## 2019-12-17 DIAGNOSIS — Z23 Encounter for immunization: Secondary | ICD-10-CM

## 2019-12-17 DIAGNOSIS — Z13220 Encounter for screening for lipoid disorders: Secondary | ICD-10-CM | POA: Diagnosis not present

## 2019-12-17 LAB — IBC + FERRITIN
Ferritin: 15.2 ng/mL (ref 10.0–291.0)
Iron: 41 ug/dL — ABNORMAL LOW (ref 42–145)
Saturation Ratios: 7.5 % — ABNORMAL LOW (ref 20.0–50.0)
Transferrin: 389 mg/dL — ABNORMAL HIGH (ref 212.0–360.0)

## 2019-12-17 LAB — CBC WITH DIFFERENTIAL/PLATELET
Basophils Absolute: 0 10*3/uL (ref 0.0–0.1)
Basophils Relative: 1.1 % (ref 0.0–3.0)
Eosinophils Absolute: 0.2 10*3/uL (ref 0.0–0.7)
Eosinophils Relative: 5 % (ref 0.0–5.0)
HCT: 38.6 % (ref 36.0–46.0)
Hemoglobin: 12.7 g/dL (ref 12.0–15.0)
Lymphocytes Relative: 21.7 % (ref 12.0–46.0)
Lymphs Abs: 0.9 10*3/uL (ref 0.7–4.0)
MCHC: 32.8 g/dL (ref 30.0–36.0)
MCV: 86.5 fl (ref 78.0–100.0)
Monocytes Absolute: 0.3 10*3/uL (ref 0.1–1.0)
Monocytes Relative: 6.4 % (ref 3.0–12.0)
Neutro Abs: 2.8 10*3/uL (ref 1.4–7.7)
Neutrophils Relative %: 65.8 % (ref 43.0–77.0)
Platelets: 238 10*3/uL (ref 150.0–400.0)
RBC: 4.46 Mil/uL (ref 3.87–5.11)
RDW: 15.5 % (ref 11.5–15.5)
WBC: 4.2 10*3/uL (ref 4.0–10.5)

## 2019-12-17 LAB — COMPREHENSIVE METABOLIC PANEL
ALT: 20 U/L (ref 0–35)
AST: 24 U/L (ref 0–37)
Albumin: 4.4 g/dL (ref 3.5–5.2)
Alkaline Phosphatase: 77 U/L (ref 39–117)
BUN: 14 mg/dL (ref 6–23)
CO2: 29 mEq/L (ref 19–32)
Calcium: 9.6 mg/dL (ref 8.4–10.5)
Chloride: 102 mEq/L (ref 96–112)
Creatinine, Ser: 0.91 mg/dL (ref 0.40–1.20)
GFR: 61.8 mL/min (ref >60.00)
Glucose, Bld: 87 mg/dL (ref 70–99)
Potassium: 4.4 mEq/L (ref 3.5–5.1)
Sodium: 139 mEq/L (ref 135–145)
Total Bilirubin: 0.5 mg/dL (ref 0.2–1.2)
Total Protein: 6.8 g/dL (ref 6.0–8.3)

## 2019-12-17 LAB — LIPID PANEL
Cholesterol: 192 mg/dL (ref 0–200)
HDL: 70.2 mg/dL (ref >39.00)
LDL Cholesterol: 106 mg/dL — ABNORMAL HIGH (ref 0–99)
NonHDL: 122.29
Total CHOL/HDL Ratio: 3
Triglycerides: 83 mg/dL (ref 0.0–149.0)
VLDL: 16.6 mg/dL (ref 0.0–40.0)

## 2020-01-04 ENCOUNTER — Other Ambulatory Visit: Payer: Self-pay | Admitting: Family Medicine

## 2020-01-04 DIAGNOSIS — E611 Iron deficiency: Secondary | ICD-10-CM

## 2020-01-16 ENCOUNTER — Encounter: Payer: Self-pay | Admitting: Family Medicine

## 2020-01-16 NOTE — Telephone Encounter (Signed)
I called the patient to explain that the referral was submitted on our end . However, it was her responsibility when she is referred outside of her primary care physician to submit to the insurance carrier the name of the specialist she has been scheduled to see. The patient stated that she understood .

## 2020-01-29 ENCOUNTER — Ambulatory Visit: Payer: No Typology Code available for payment source

## 2020-01-29 ENCOUNTER — Other Ambulatory Visit: Payer: Self-pay

## 2020-01-29 ENCOUNTER — Other Ambulatory Visit (INDEPENDENT_AMBULATORY_CARE_PROVIDER_SITE_OTHER): Payer: No Typology Code available for payment source

## 2020-01-29 ENCOUNTER — Encounter: Payer: Self-pay | Admitting: Family Medicine

## 2020-01-29 DIAGNOSIS — E611 Iron deficiency: Secondary | ICD-10-CM | POA: Diagnosis not present

## 2020-01-29 LAB — IBC + FERRITIN
Ferritin: 22.7 ng/mL (ref 10.0–291.0)
Iron: 80 ug/dL (ref 42–145)
Saturation Ratios: 15.1 % — ABNORMAL LOW (ref 20.0–50.0)
Transferrin: 378 mg/dL — ABNORMAL HIGH (ref 212.0–360.0)

## 2020-02-10 ENCOUNTER — Encounter: Payer: Self-pay | Admitting: Family Medicine

## 2020-02-10 ENCOUNTER — Other Ambulatory Visit: Payer: Self-pay | Admitting: Family Medicine

## 2020-02-10 DIAGNOSIS — E611 Iron deficiency: Secondary | ICD-10-CM

## 2020-03-02 ENCOUNTER — Other Ambulatory Visit: Payer: Self-pay

## 2020-03-02 ENCOUNTER — Ambulatory Visit (INDEPENDENT_AMBULATORY_CARE_PROVIDER_SITE_OTHER): Payer: No Typology Code available for payment source

## 2020-03-02 DIAGNOSIS — Z23 Encounter for immunization: Secondary | ICD-10-CM

## 2020-03-02 NOTE — Progress Notes (Signed)
Patient presented for Shingrix injection to left deltoid, patient voiced no concerns nor showed any signs of distress during injection.

## 2020-03-29 ENCOUNTER — Ambulatory Visit (INDEPENDENT_AMBULATORY_CARE_PROVIDER_SITE_OTHER): Payer: No Typology Code available for payment source | Admitting: Gastroenterology

## 2020-03-29 ENCOUNTER — Other Ambulatory Visit: Payer: Self-pay

## 2020-03-29 VITALS — BP 124/62 | HR 69 | Temp 98.3°F | Ht 62.0 in | Wt 152.4 lb

## 2020-03-29 DIAGNOSIS — Z1211 Encounter for screening for malignant neoplasm of colon: Secondary | ICD-10-CM | POA: Diagnosis not present

## 2020-03-29 DIAGNOSIS — D508 Other iron deficiency anemias: Secondary | ICD-10-CM
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
## 2020-03-30 LAB — URINALYSIS
Bilirubin, UA: NEGATIVE
Glucose, UA: NEGATIVE
Ketones, UA: NEGATIVE
Leukocytes,UA: NEGATIVE
Nitrite, UA: NEGATIVE
Protein,UA: NEGATIVE
RBC, UA: NEGATIVE
Specific Gravity, UA: 1.008 (ref 1.005–1.030)
Urobilinogen, Ur: 0.2 mg/dL (ref 0.2–1.0)
pH, UA: 7.5 (ref 5.0–7.5)

## 2020-03-30 LAB — CELIAC DISEASE AB SCREEN W/RFX
Antigliadin Abs, IgA: 4 units (ref 0–19)
IgA/Immunoglobulin A, Serum: 180 mg/dL (ref 87–352)
Transglutaminase IgA: <2 U/mL (ref 0–3)

## 2020-04-05 ENCOUNTER — Encounter: Payer: Self-pay | Admitting: Gastroenterology

## 2020-04-07 ENCOUNTER — Telehealth: Payer: Self-pay

## 2020-04-07 NOTE — Telephone Encounter (Signed)
Called patient to find out if patient was ready to scheduled colonoscopy that Dr. Vicente Males wanted her to have done. She says she wants to scheduled it but has to check with daughter and will give [place] a call back to when she can do it

## 2020-04-16 ENCOUNTER — Telehealth: Payer: Self-pay

## 2020-04-16 ENCOUNTER — Other Ambulatory Visit: Payer: Self-pay

## 2020-04-16 DIAGNOSIS — Z1211 Encounter for screening for malignant neoplasm of colon: Secondary | ICD-10-CM

## 2020-04-16 NOTE — Telephone Encounter (Signed)
Patient returned call to [facility] to schedule her colonoscopy with Dr. Vicente Males.  Colonoscopy has been scheduled with Dr. Vicente Males on 05/10/20.  Pt has been advised of COVID Test date Thursday 05/06/20 at [facility] located on [address].  Instructions sent via mychart.  Patient states she already has a bowel prep.  Thanks,  [place], [place]

## 2020-05-06 ENCOUNTER — Other Ambulatory Visit: Payer: Self-pay

## 2020-05-06 ENCOUNTER — Other Ambulatory Visit
Admission: RE | Admit: 2020-05-06 | Discharge: 2020-05-06 | Disposition: A | Discharge status: Home / Self Care | Payer: No Typology Code available for payment source | Source: Ambulatory Visit | Attending: Gastroenterology | Admitting: Gastroenterology

## 2020-05-06 DIAGNOSIS — Z01812 Encounter for preprocedural laboratory examination: Secondary | ICD-10-CM | POA: Insufficient documentation

## 2020-05-06 DIAGNOSIS — Z20822 Contact with and (suspected) exposure to covid-19: Secondary | ICD-10-CM | POA: Insufficient documentation

## 2020-05-06 LAB — SARS CORONAVIRUS 2 (TAT 6-24 HRS): SARS Coronavirus 2: NEGATIVE

## 2020-05-10 ENCOUNTER — Encounter
Admission: RE | Disposition: A | Discharge status: Home / Self Care | Payer: Self-pay | Source: Home / Self Care | Attending: Gastroenterology

## 2020-05-10 ENCOUNTER — Ambulatory Visit: Payer: No Typology Code available for payment source | Admitting: Anesthesiology

## 2020-05-10 ENCOUNTER — Other Ambulatory Visit: Payer: Self-pay

## 2020-05-10 ENCOUNTER — Ambulatory Visit
Admission: RE | Admit: 2020-05-10 | Discharge: 2020-05-10 | Disposition: A | Discharge status: Home / Self Care | Payer: No Typology Code available for payment source | Attending: Gastroenterology | Admitting: Gastroenterology

## 2020-05-10 ENCOUNTER — Encounter: Payer: Self-pay | Admitting: Gastroenterology

## 2020-05-10 DIAGNOSIS — Z880 Allergy status to penicillin: Secondary | ICD-10-CM | POA: Insufficient documentation

## 2020-05-10 DIAGNOSIS — M1990 Unspecified osteoarthritis, unspecified site: Secondary | ICD-10-CM | POA: Diagnosis not present

## 2020-05-10 DIAGNOSIS — Z1211 Encounter for screening for malignant neoplasm of colon: Secondary | ICD-10-CM | POA: Insufficient documentation

## 2020-05-10 DIAGNOSIS — Z96643 Presence of artificial hip joint, bilateral: Secondary | ICD-10-CM | POA: Diagnosis not present

## 2020-05-10 DIAGNOSIS — D122 Benign neoplasm of ascending colon: Secondary | ICD-10-CM | POA: Insufficient documentation

## 2020-05-10 DIAGNOSIS — K5730 Diverticulosis of large intestine without perforation or abscess without bleeding: Secondary | ICD-10-CM | POA: Diagnosis not present

## 2020-05-10 DIAGNOSIS — Z885 Allergy status to narcotic agent status: Secondary | ICD-10-CM | POA: Diagnosis not present

## 2020-05-10 HISTORY — PX: COLONOSCOPY WITH PROPOFOL: SHX5780

## 2020-05-10 SURGERY — COLONOSCOPY WITH PROPOFOL
Anesthesia: General

## 2020-05-10 MED ORDER — PROPOFOL 10 MG/ML IV BOLUS
INTRAVENOUS | Status: AC
  Filled 2020-05-10: qty 20

## 2020-05-10 MED ORDER — PROPOFOL 500 MG/50ML IV EMUL
INTRAVENOUS | Status: AC
  Filled 2020-05-10: qty 50

## 2020-05-10 MED ORDER — MIDAZOLAM HCL 2 MG/2ML IJ SOLN
INTRAMUSCULAR | Status: AC
  Filled 2020-05-10: qty 2

## 2020-05-10 MED ORDER — LIDOCAINE HCL (PF) 2 % IJ SOLN
INTRAMUSCULAR | Status: AC
  Filled 2020-05-10: qty 5

## 2020-05-10 MED ORDER — PROPOFOL 500 MG/50ML IV EMUL
INTRAVENOUS | Status: DC | PRN
  Administered 2020-05-10: 100 ug/kg/min via INTRAVENOUS

## 2020-05-10 MED ORDER — PROPOFOL 10 MG/ML IV BOLUS
INTRAVENOUS | Status: DC | PRN
  Administered 2020-05-10: 10 mg via INTRAVENOUS
  Administered 2020-05-10: 40 mg via INTRAVENOUS

## 2020-05-10 MED ORDER — SODIUM CHLORIDE 0.9 % IV SOLN: INTRAVENOUS | Status: DC

## 2020-05-10 NOTE — Anesthesia Postprocedure Evaluation (Signed)
Anesthesia Post Note  Patient: [name] [name]  Procedure(s) Performed: COLONOSCOPY WITH PROPOFOL (N/A )  Patient location during evaluation: Endoscopy Anesthesia Type: General [name]: awake and alert Pain management: pain [name] Vital Signs Assessment: post-procedure vital signs reviewed and stable Respiratory status: spontaneous breathing, nonlabored ventilation, respiratory function stable and patient connected to nasal cannula oxygen Cardiovascular status: blood pressure returned to baseline and stable Postop Assessment: no apparent nausea or vomiting Anesthetic complications: no   No complications documented.   Last Vitals:  Vitals:   05/10/20 0802 05/10/20 0917  BP: (!) 162/85 (!) 112/58  Pulse: 66 64  Resp: 18 16  Temp: 36.8 C   SpO2: 100% 98%    Last Pain:  Vitals:   05/10/20 0917  TempSrc:   PainSc: 0-No pain                 Arita Miss
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
## 2020-05-10 NOTE — Op Note (Signed)
[facility] Gastroenterology Patient Name: [name] [name] Procedure Date: 05/10/2020 8:39 AM MRN: [id number] Account #: [id number] Date of Birth: 1951/02/28 Admit Type: Outpatient Age: 69 Room: [facility] ENDO ROOM 4 Gender: Female Note Status: Finalized Procedure:             Colonoscopy Indications:           Screening for colorectal malignant neoplasm Providers:             Jonathon Bellows MD, MD Referring MD:          Angela Adam. Caryl Bis (Referring MD) Medicines:             Monitored Anesthesia Care Complications:         No immediate complications. Procedure:             Pre-Anesthesia Assessment:                        - Prior to the procedure, a History and Physical was                         performed, and patient medications, allergies and                         sensitivities were reviewed. The patient's tolerance                         of previous anesthesia was reviewed.                        - The risks and benefits of the procedure and the                         sedation options and risks were discussed with the                         patient. All questions were answered and informed                         consent was obtained.                        - ASA Grade Assessment: II - A patient with mild                         systemic disease.                        After obtaining informed consent, the colonoscope was                         passed under direct vision. Throughout the procedure,                         the patient's blood pressure, pulse, and oxygen                         saturations were monitored continuously. The                         Colonoscope was introduced through the anus and  advanced to the the cecum, identified by the                         appendiceal orifice. The colonoscopy was performed                         with ease. The patient tolerated the procedure well.                         The quality  of the bowel preparation was excellent. Findings:      The perianal and digital rectal examinations were normal.      Multiple small-mouthed diverticula were found in the left colon.      A 3 mm polyp was found in the proximal ascending colon. The polyp was       sessile. The polyp was removed with a cold biopsy forceps. Resection and       retrieval were complete.      The exam was otherwise without abnormality on direct and retroflexion       views. Impression:            - Diverticulosis in the left colon.                        - One 3 mm polyp in the proximal ascending colon,                         removed with a cold biopsy forceps. Resected and                         retrieved.                        - The examination was otherwise normal on direct and                         retroflexion views. Recommendation:        - Discharge patient to home (with escort).                        - Resume previous diet.                        - Continue present medications.                        - Repeat colonoscopy for surveillance based on                         pathology results. Procedure Code(s):     --- Professional ---                        [phone], Colonoscopy, flexible; with biopsy, single or                         multiple Diagnosis Code(s):     --- Professional ---                        Z12.11, Encounter for screening for malignant neoplasm  of colon                        K63.5, Polyp of colon                        K57.30, Diverticulosis of large intestine without                         perforation or abscess without bleeding CPT copyright 2019 American Medical Association. All rights reserved. The codes documented in this report are preliminary and upon coder review may  be revised to meet current compliance requirements. Jonathon Bellows, MD Jonathon Bellows MD, MD 05/10/2020 9:13:17 AM This report has been signed electronically. Number of Addenda: 0 Note  Initiated On: 05/10/2020 8:39 AM Scope Withdrawal Time: 0 hours 18 minutes 33 seconds  Total Procedure Duration: 0 hours 21 minutes 9 seconds  Estimated Blood Loss:  Estimated blood loss: none.      [facility]

## 2020-05-10 NOTE — Transfer of Care (Signed)
Immediate Anesthesia Transfer of Care Note  Patient: [name] [name]  Procedure(s) Performed: COLONOSCOPY WITH PROPOFOL (N/A )  Patient Location: PACU  Anesthesia Type:General  [name]: sedated  Airway & Oxygen Therapy: Patient Spontanous Breathing and Patient connected to nasal cannula oxygen  Post-op Assessment: Report given to RN and Post -op Vital signs reviewed and stable  Post vital signs: Reviewed and stable  Last Vitals:  Vitals Value Taken Time  BP 112/58 05/10/20 0917  Temp    Pulse 64 05/10/20 0917  Resp 16 05/10/20 0917  SpO2 98 % 05/10/20 0917    Last Pain:  Vitals:   05/10/20 0917  TempSrc:   PainSc: 0-No pain         Complications: No complications documented.
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
## 2020-05-11 ENCOUNTER — Encounter: Payer: Self-pay | Admitting: Gastroenterology

## 2020-05-11 LAB — SURGICAL PATHOLOGY

## 2020-05-12 ENCOUNTER — Encounter: Payer: Self-pay | Admitting: Gastroenterology

## 2020-06-09 ENCOUNTER — Telehealth: Payer: Self-pay

## 2020-06-09 DIAGNOSIS — D508 Other iron deficiency anemias: Secondary | ICD-10-CM

## 2020-06-09 NOTE — Telephone Encounter (Signed)
-----   Message from Rushie [name], Perrin sent at 05/10/2020  8:49 AM EDT ----- Regarding: Pt due for labs in mid July [name]  Please Schedule her for an # cbc and iron studies in July mid please

## 2020-06-09 NOTE — Telephone Encounter (Signed)
Patient verbalized understanding. She states she will get this done soon. Order labs

## 2020-06-24 ENCOUNTER — Other Ambulatory Visit: Payer: Self-pay

## 2020-06-24 ENCOUNTER — Other Ambulatory Visit
Admission: RE | Admit: 2020-06-24 | Discharge: 2020-06-24 | Disposition: A | Discharge status: Home / Self Care | Payer: No Typology Code available for payment source | Attending: Gastroenterology | Admitting: Gastroenterology

## 2020-06-24 DIAGNOSIS — D508 Other iron deficiency anemias: Secondary | ICD-10-CM | POA: Insufficient documentation

## 2020-06-24 LAB — CBC
HCT: 42.2 % (ref 36.0–46.0)
Hemoglobin: 13.9 g/dL (ref 12.0–15.0)
MCH: 28.9 pg (ref 26.0–34.0)
MCHC: 32.9 g/dL (ref 30.0–36.0)
MCV: 87.7 fL (ref 80.0–100.0)
Platelets: 216 10*3/uL (ref 150–400)
RBC: 4.81 MIL/uL (ref 3.87–5.11)
RDW: 13.2 % (ref 11.5–15.5)
WBC: 4.7 10*3/uL (ref 4.0–10.5)
nRBC: 0 % (ref 0.0–0.2)

## 2020-06-24 LAB — IRON AND TIBC
Iron: 92 ug/dL (ref 28–170)
Saturation Ratios: 19 % (ref 10.4–31.8)
TIBC: 479 ug/dL — ABNORMAL HIGH (ref 250–450)
UIBC: 387 ug/dL

## 2020-06-24 LAB — FERRITIN: Ferritin: 28 ng/mL (ref 11–307)

## 2020-07-06 ENCOUNTER — Encounter: Payer: Self-pay | Admitting: Gastroenterology

## 2020-07-06 ENCOUNTER — Telehealth (INDEPENDENT_AMBULATORY_CARE_PROVIDER_SITE_OTHER): Payer: No Typology Code available for payment source | Admitting: Gastroenterology

## 2020-07-06 DIAGNOSIS — D508 Other iron deficiency anemias: Secondary | ICD-10-CM

## 2020-07-06 NOTE — Progress Notes (Signed)
Jonathon Bellows , MD [address]  [place]  [place], [place] [address]  Main: [phone]  Fax: [phone]   Primary Care Physician: Leone Haven, MD  Virtual Visit via Telephone Note  I connected with patient on 07/06/20 at  3:00 PM EDT by telephone and verified that I am speaking with the correct person using two identifiers.   I discussed the limitations, risks, security and privacy concerns of performing an evaluation and management service by telephone and the availability of in person appointments. I also discussed with the patient that there may be a patient responsible charge related to this service. The patient expressed understanding and agreed to proceed.  Location of Patient: Home Location of Provider: Home Persons involved: Patient and provider only   History of Present Illness: Chief Complaint  Patient presents with  . Follow-up    HPI: ELLANOR FEUERSTEIN is a 69 y.o. female    Summary of history :  Initially referred and seen on 03/29/2020 for iron deficiency anemia. Hemoglobin in October 2019 was 12.7 g with an MCV of 77.5.  She was normocytic about 2 years back.  At that point of time she had a low ferritin of 34 and an elevated TIBC.  At that point of time the plan was for her to be seen by GI but for some reason was not seen.  In February 2021 she had normocytic blood picture with a ferritin of 15.2.  There was no clear etiology for the iron deficiency.  She denies any NSAID use.  No blood thinners.  Last colonoscopy 2010.  No family history of colon cancer or polyps.  No vaginal bleeding, nasal bleeds, rectal bleeding, hematemesis.  She does state that she has been donating blood for a long time once every 60 days.  Last set of blood donation was in April 2021   Interval history 03/29/2020-07/06/2020   06/24/2020: Hemoglobin 13.9 g with an MCV of 87 iron 92 ferritin 28 03/29/2020: Urinalysis normal, celiac serology negative, ferritin was  22.7  05/10/2020: Colonoscopy: 3 mm polyp in the ascending colon diverticulosis of the sigmoid colon.  Ascending colon polyp tubular adenoma. She is doing well with no new complaints. Not donating any blood.  Current Outpatient Medications  Medication Sig Dispense Refill  . cholecalciferol (VITAMIN D) 1000 units tablet Take 2,000 Units by mouth daily.    . Omega-3 Fatty Acids (FISH OIL) 1000 MG CAPS Take 1,000 mg by mouth daily. (Patient not taking: Reported on 07/06/2020)     No current facility-administered medications for this visit.    Allergies as of 07/06/2020 - Review Complete 07/06/2020  Allergen Reaction Noted  . Codeine  07/24/2011  . Penicillins Rash 07/24/2011    Review of Systems:    All systems reviewed and negative except where noted in HPI.   Observations/Objective:  Labs: CMP     Component Value Date/Time   NA 139 12/17/2019 0856   K 4.4 12/17/2019 0856   CL 102 12/17/2019 0856   CO2 29 12/17/2019 0856   GLUCOSE 87 12/17/2019 0856   BUN 14 12/17/2019 0856   CREATININE 0.91 12/17/2019 0856   CREATININE 0.80 08/30/2018 1626   CALCIUM 9.6 12/17/2019 0856   PROT 6.8 12/17/2019 0856   ALBUMIN 4.4 12/17/2019 0856   AST 24 12/17/2019 0856   ALT 20 12/17/2019 0856   ALKPHOS 77 12/17/2019 0856   BILITOT 0.5 12/17/2019 0856   GFRNONAA >60 06/21/2016 0422   GFRAA >60  06/21/2016 0422   Lab Results  Component Value Date   WBC 4.7 06/24/2020   HGB 13.9 06/24/2020   HCT 42.2 06/24/2020   MCV 87.7 06/24/2020   PLT 216 06/24/2020    Imaging Studies: No results found.  Assessment and Plan:   MICALA SALTSMAN is a 69 y.o. y/o female here to follow-up for iron deficiency anemia.  Based on her history the most likely etiology is long standing blood donation.  She has always been iron deficient but not overtly anemic.  Very likely due to the effects of blood donation.  Repeat iron studies have improved.  Hb was in normal range.   Plan   1.    Probably best  to avoid blood donation for another 4 to 6 months to ensure hemoglobin is stable and ferritin has not dropped further and if stable then can resume blood donation at that point. She says that she will obtain a CBC and iron studies with Dr. Caryl Bis in December when she has a follow-up appointment.     I discussed the assessment and treatment plan with the patient. The patient was provided an opportunity to ask questions and all were answered. The patient agreed with the plan and demonstrated an understanding of the instructions.   The patient was advised to call back or seek an in-person evaluation if the symptoms worsen or if the condition fails to improve as anticipated.  I provided 12 minutes of non-face-to-face time during this encounter.  Dr Jonathon Bellows MD,MRCP [facility]) Gastroenterology/Hepatology Pager: [phone]   Speech recognition software was used to dictate this note.

## 2020-12-18 ENCOUNTER — Encounter: Payer: Self-pay | Admitting: Family Medicine

## 2020-12-18 DIAGNOSIS — Z1231 Encounter for screening mammogram for malignant neoplasm of breast: Secondary | ICD-10-CM

## 2020-12-27 ENCOUNTER — Encounter: Payer: Self-pay | Admitting: Family Medicine

## 2021-01-14 ENCOUNTER — Other Ambulatory Visit (HOSPITAL_COMMUNITY): Payer: Self-pay | Admitting: Pharmacist

## 2021-02-07 ENCOUNTER — Other Ambulatory Visit: Payer: Self-pay

## 2021-02-07 ENCOUNTER — Ambulatory Visit
Admission: RE | Admit: 2021-02-07 | Discharge: 2021-02-07 | Disposition: A | Discharge status: Home / Self Care | Payer: No Typology Code available for payment source | Source: Ambulatory Visit | Attending: Family Medicine | Admitting: Family Medicine

## 2021-02-07 DIAGNOSIS — Z1231 Encounter for screening mammogram for malignant neoplasm of breast: Secondary | ICD-10-CM | POA: Insufficient documentation

## 2021-03-10 ENCOUNTER — Other Ambulatory Visit: Payer: Self-pay

## 2021-03-16 ENCOUNTER — Encounter: Payer: No Typology Code available for payment source | Admitting: Family Medicine

## 2021-03-24 ENCOUNTER — Encounter: Payer: Self-pay | Admitting: Family Medicine

## 2021-03-24 ENCOUNTER — Ambulatory Visit (INDEPENDENT_AMBULATORY_CARE_PROVIDER_SITE_OTHER): Payer: No Typology Code available for payment source | Admitting: Family Medicine

## 2021-03-24 ENCOUNTER — Other Ambulatory Visit: Payer: Self-pay

## 2021-03-24 VITALS — BP 120/80 | HR 65 | Temp 98.0°F | Ht 62.0 in | Wt 148.2 lb

## 2021-03-24 DIAGNOSIS — Z13220 Encounter for screening for lipoid disorders: Secondary | ICD-10-CM | POA: Diagnosis not present

## 2021-03-24 DIAGNOSIS — E611 Iron deficiency: Secondary | ICD-10-CM | POA: Diagnosis not present

## 2021-03-24 DIAGNOSIS — M189 Osteoarthritis of first carpometacarpal joint, unspecified: Secondary | ICD-10-CM | POA: Insufficient documentation

## 2021-03-24 DIAGNOSIS — Z0001 Encounter for general adult medical examination with abnormal findings: Secondary | ICD-10-CM

## 2021-03-24 DIAGNOSIS — Z23 Encounter for immunization: Secondary | ICD-10-CM

## 2021-03-24 DIAGNOSIS — L639 Alopecia areata, unspecified: Secondary | ICD-10-CM | POA: Diagnosis not present

## 2021-03-24 DIAGNOSIS — E663 Overweight: Secondary | ICD-10-CM | POA: Diagnosis not present

## 2021-03-24 DIAGNOSIS — M180 Bilateral primary osteoarthritis of first carpometacarpal joints: Secondary | ICD-10-CM | POA: Diagnosis not present

## 2021-03-24 LAB — CBC WITH DIFFERENTIAL/PLATELET
Basophils Absolute: 0 10*3/uL (ref 0.0–0.1)
Basophils Relative: 0.6 % (ref 0.0–3.0)
Eosinophils Absolute: 0.1 10*3/uL (ref 0.0–0.7)
Eosinophils Relative: 3.6 % (ref 0.0–5.0)
HCT: 40 % (ref 36.0–46.0)
Hemoglobin: 13.6 g/dL (ref 12.0–15.0)
Lymphocytes Relative: 21.1 % (ref 12.0–46.0)
Lymphs Abs: 0.8 10*3/uL (ref 0.7–4.0)
MCHC: 33.9 g/dL (ref 30.0–36.0)
MCV: 87.3 fl (ref 78.0–100.0)
Monocytes Absolute: 0.2 10*3/uL (ref 0.1–1.0)
Monocytes Relative: 6 % (ref 3.0–12.0)
Neutro Abs: 2.6 10*3/uL (ref 1.4–7.7)
Neutrophils Relative %: 68.7 % (ref 43.0–77.0)
Platelets: 210 10*3/uL (ref 150.0–400.0)
RBC: 4.58 Mil/uL (ref 3.87–5.11)
RDW: 12.8 % (ref 11.5–15.5)
WBC: 3.8 10*3/uL — ABNORMAL LOW (ref 4.0–10.5)

## 2021-03-24 LAB — COMPREHENSIVE METABOLIC PANEL
ALT: 23 U/L (ref 0–35)
AST: 23 U/L (ref 0–37)
Albumin: 4.3 g/dL (ref 3.5–5.2)
Alkaline Phosphatase: 71 U/L (ref 39–117)
BUN: 18 mg/dL (ref 6–23)
CO2: 31 mEq/L (ref 19–32)
Calcium: 9.6 mg/dL (ref 8.4–10.5)
Chloride: 102 mEq/L (ref 96–112)
Creatinine, Ser: 0.78 mg/dL (ref 0.40–1.20)
GFR: 78.53 mL/min (ref >60.00)
Glucose, Bld: 91 mg/dL (ref 70–99)
Potassium: 4.1 mEq/L (ref 3.5–5.1)
Sodium: 140 mEq/L (ref 135–145)
Total Bilirubin: 0.5 mg/dL (ref 0.2–1.2)
Total Protein: 6.7 g/dL (ref 6.0–8.3)

## 2021-03-24 LAB — LIPID PANEL
Cholesterol: 215 mg/dL — ABNORMAL HIGH (ref 0–200)
HDL: 72.8 mg/dL (ref >39.00)
LDL Cholesterol: 125 mg/dL — ABNORMAL HIGH (ref 0–99)
NonHDL: 141.88
Total CHOL/HDL Ratio: 3
Triglycerides: 82 mg/dL (ref 0.0–149.0)
VLDL: 16.4 mg/dL (ref 0.0–40.0)

## 2021-03-24 LAB — IBC + FERRITIN
Ferritin: 29.2 ng/mL (ref 10.0–291.0)
Iron: 56 ug/dL (ref 42–145)
Saturation Ratios: 11.5 % — ABNORMAL LOW (ref 20.0–50.0)
Transferrin: 348 mg/dL (ref 212.0–360.0)

## 2021-03-24 LAB — HEMOGLOBIN A1C: Hgb A1c MFr Bld: 5.7 % (ref 4.6–6.5)

## 2021-03-24 MED ORDER — MELOXICAM 7.5 MG PO TABS
7.5000 mg | ORAL_TABLET | Freq: Every day | ORAL | 0 refills | Status: DC
  Filled 2021-03-24: qty 30, 30d supply, fill #0

## 2021-03-24 NOTE — Assessment & Plan Note (Signed)
Follow up labs ordered.

## 2021-03-24 NOTE — Patient Instructions (Signed)
Nice to see you. Please try the meloxicam for your hand pain. Please let me know the dermatologist you would like the referral to. We will contact you with your lab results.

## 2021-03-24 NOTE — Assessment & Plan Note (Addendum)
Refer to dermatology.  She will let me know the name of her dermatologist.

## 2021-03-24 NOTE — Progress Notes (Signed)
[name] Rumps, MD Phone: [phone]  [name] [name] is a 70 y.o. female who presents today for CPE.  Diet: Generally healthy, she avoids processed foods.  No soda or sweet tea. Exercise: She exercises when she can given her busy job schedule.  She does go hiking and walks about twice a week. Pap smear: Aged out, no history of abnormals Colonoscopy: 05/10/2020 with 7-year recall Mammogram: 02/07/2021 negative Family history-  Colon cancer: Now  Breast cancer: No  Ovarian cancer: No Menses: Postmenopausal with no bleeding Vaccines-   Flu: Up-to-date  Tetanus: Up-to-date  Shingles: Up-to-date  COVID19: Up-to-date  Pneumonia: Due for pneumonia vaccine 23 Hep C Screening: Up-to-date Tobacco use: No Alcohol use: Occasional Illicit Drug use: No Dentist: Yes Ophthalmology: Yes  Bilateral hand pain: Patient notes discomfort at her CMC joints of her thumbs bilaterally.  This been going on a year.  It was gradual in onset.  No other joint pain.  Occasional redness though she is unsure if that is related to wearing gloves throughout the day.  No medications tried.  Alopecia areata: Patient needs a referral to dermatology.   Active Ambulatory Problems    Diagnosis Date Noted  . Alopecia areata 07/24/2011  . Encounter for general adult medical examination with abnormal findings 08/12/2013  . Arthralgia 08/27/2015  . Left hip pain 12/28/2015  . Piriformis syndrome of left side 12/28/2015  . Arthritis of left hip 12/28/2015  . Preoperative clearance 05/08/2016  . S/P right THA, AA 06/20/2016  . CMC arthritis, thumb, degenerative 03/24/2021  . Iron deficiency 03/24/2021   Resolved Ambulatory Problems    Diagnosis Date Noted  . General medical exam 07/29/2012  . Menopausal disorder 07/29/2012  . S/P left THA, AA 05/23/2016   Past Medical History:  Diagnosis Date  . Alopecia   . Arthritis   . C3 cervical fracture (HCC)     Family History  Problem Relation Age of Onset   . Cancer Mother        lung ca, died in 36s  . Heart disease Father   . Breast cancer Neg Hx     Social History   Socioeconomic History  . Marital status: Married    Spouse name: Not on file  . Number of children: Not on file  . Years of education: Not on file  . Highest education level: Not on file  Occupational History  . Not on file  Tobacco Use  . Smoking status: Never Smoker  . Smokeless tobacco: Never Used  Substance and Sexual Activity  . Alcohol use: Yes    Alcohol/week: 0.0 standard drinks    Comment: glass of wine every month or two  . Drug use: No  . Sexual activity: Yes    Partners: Male  Other Topics Concern  . Not on file  Social History Narrative  . Not on file   Social Determinants of Health   Financial Resource Strain: Not on file  Food Insecurity: Not on file  Transportation Needs: Not on file  Physical Activity: Not on file  Stress: Not on file  Social Connections: Not on file  Intimate Partner Violence: Not on file    ROS  General:  Negative for nexplained weight loss, fever Skin: Negative for new or changing [name], sore that won't heal HEENT: Negative for trouble hearing, trouble seeing, ringing in ears, mouth sores, hoarseness, change in voice, dysphagia. CV:  Negative for chest pain, dyspnea, edema, palpitations Resp: Negative for cough, dyspnea, hemoptysis GI: Negative  for nausea, vomiting, diarrhea, constipation, abdominal pain, melena, hematochezia. GU: Negative for dysuria, incontinence, urinary hesitance, hematuria, vaginal or penile discharge, polyuria, sexual difficulty, lumps in testicle or breasts MSK: Negative for muscle cramps or aches, joint pain or swelling Neuro: Negative for headaches, weakness, numbness, dizziness, passing out/fainting Psych: Negative for depression, anxiety, memory problems  Objective  Physical Exam Vitals:   03/24/21 1005  BP: 120/80  Pulse: 65  Temp: 98 F (36.7 C)  SpO2: 99%    BP Readings  from Last 3 Encounters:  03/24/21 120/80  05/10/20 134/73  03/29/20 124/62   Wt Readings from Last 3 Encounters:  03/24/21 148 lb 3.2 oz (67.2 kg)  05/10/20 145 lb (65.8 kg)  03/29/20 152 lb 6.4 oz (69.1 kg)    Physical Exam Constitutional:      General: She is not in acute distress.    Appearance: She is not diaphoretic.  Eyes:     Conjunctiva/sclera: Conjunctivae normal.     Pupils: Pupils are equal, round, and reactive to light.  Cardiovascular:     Rate and Rhythm: Normal rate and regular rhythm.     Heart sounds: Normal heart sounds.  Pulmonary:     Effort: Pulmonary effort is normal.     Breath sounds: Normal breath sounds.  Abdominal:     General: Bowel sounds are normal. There is no distension.     Palpations: Abdomen is soft.     Tenderness: There is no abdominal tenderness. There is no guarding or rebound.  Genitourinary:    Comments: [name] [name], CMA served as chaperone, bilateral breasts with no skin changes, nipple inversion, masses, or tenderness, no axillary masses bilaterally Musculoskeletal:     Right lower leg: No edema.     Left lower leg: No edema.     Comments: Slight tenderness over the bilateral thumb CMC joints,, no warmth or erythema or swelling, no anatomic snuffbox tenderness bilaterally, negative Finkelstein's bilaterally  Lymphadenopathy:     Cervical: No cervical adenopathy.  Skin:    General: Skin is warm and dry.  Neurological:     Mental Status: She is alert.  Psychiatric:        Mood and Affect: Mood normal.      Assessment/Plan:   Problem List Items Addressed This Visit    Alopecia areata    Refer to dermatology.  She will let me know the name of her dermatologist.      Encounter for general adult medical examination with abnormal findings - Primary    Physical exam completed.  Encouraged continued healthy diet and try to get as much exercise as she is able to.  Mammogram and colonoscopy are up-to-date.  She will be given her  Pneumovax vaccine.  She is deferring her second COVID booster until the fall.  Lab work as outlined.      CMC arthritis, thumb, degenerative    I suspect osteoarthritis is the cause of her discomfort.  We will treat with meloxicam on an as-needed basis.  If it worsens or she develops other symptoms she will let us know.      Relevant Medications   meloxicam (MOBIC) 7.5 MG tablet   Iron deficiency    Follow-up labs ordered.      Relevant Orders   CBC w/Diff   IBC + Ferritin    Other Visit Diagnoses    Lipid screening       Relevant Orders   Comp Met (CMET)   Lipid panel  Overweight       Relevant Orders   HgB A1c   Need for pneumococcal vaccination       Relevant Orders   Pneumococcal polysaccharide vaccine 23-valent greater than or equal to 2yo subcutaneous/IM (Completed)      The patient was given Pneumovax 23 today.  5 to 10 minutes after getting the vaccine she had slight erythema around the site of the vaccine with some itching.  She denies any throat swelling, tongue swelling, and breathing issues.  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  She notes there was likely something in the media that she is allergic to as she does have some allergies.  I agree with that assessment.  There was slight erythema around the injection site.  No warmth to the injection site.  I discussed monitoring her for a few more minutes though she deferred this.  She will take a Benadryl once she has driven to work..  She was advised to seek medical attention for any anaphylaxis type symptoms and she reported she would go talk to one of the anesthesiologists at work if this occurred.  Return in about 1 year (around 03/24/2022) for CPE.  This visit occurred during the SARS-CoV-2 public health emergency.  Safety protocols were in place, including screening questions prior to the visit, additional usage of staff PPE, and extensive cleaning of exam room while observing  appropriate contact time as indicated for disinfecting solutions.    [name] Rumps, MD [place]

## 2021-03-24 NOTE — Assessment & Plan Note (Signed)
I suspect osteoarthritis is the cause of her discomfort.  We will treat with meloxicam on an as-needed basis.  If it worsens or she develops other symptoms she will let us know.

## 2021-03-24 NOTE — Assessment & Plan Note (Signed)
Physical exam completed.  Encouraged continued healthy diet and try to get as much exercise as she is able to.  Mammogram and colonoscopy are up-to-date.  She will be given her Pneumovax vaccine.  She is deferring her second COVID booster until the fall.  Lab work as outlined.

## 2021-03-28 ENCOUNTER — Other Ambulatory Visit: Payer: Self-pay

## 2021-03-29 ENCOUNTER — Telehealth: Payer: Self-pay

## 2021-03-29 DIAGNOSIS — D729 Disorder of white blood cells, unspecified: Secondary | ICD-10-CM

## 2021-03-29 NOTE — Telephone Encounter (Signed)
-----   Message from Leone Haven, MD sent at 03/28/2021  1:27 PM EDT ----- Please let the patient know that her A1c is now in the prediabetic range.  She needs to maintain a healthy diet as she has been though does need to try to exercise a little bit more to help prevent this from worsening.  Her LDL cholesterol is elevated.  She is in an intermediate risk zone with regards to her cardiovascular risk.  This means that she could potentially benefit from a statin though could also work on diet and exercise to bring this down.  If she would like the statin I can send it to her pharmacy.  Please let the patient know that her ferritin levels have continued to remain higher than they were previously.  It looks like she saw GI previously and they felt her iron deficiency was related to blood donation.  Has she been donating blood?  Her WBC was slightly low.  I would suggest rechecking this in 1 to 2 weeks.  Thanks.  The 10-year ASCVD risk score Mikey Bussing DC Brooke Bonito., et al., 2013) is: 5.7%   Values used to calculate the score:     Age: 70 years     Sex: Female     Is Non-Hispanic African American: No     Diabetic: No     Tobacco smoker: No     Systolic Blood Pressure: 294 mmHg     Is BP treated: No     HDL Cholesterol: 72.8 mg/dL     Total Cholesterol: 215 mg/dL

## 2021-04-08 ENCOUNTER — Encounter: Payer: Self-pay | Admitting: Family Medicine

## 2021-04-08 DIAGNOSIS — Z1283 Encounter for screening for malignant neoplasm of skin: Secondary | ICD-10-CM

## 2021-04-12 ENCOUNTER — Other Ambulatory Visit (INDEPENDENT_AMBULATORY_CARE_PROVIDER_SITE_OTHER): Payer: No Typology Code available for payment source

## 2021-04-12 ENCOUNTER — Other Ambulatory Visit: Payer: Self-pay

## 2021-04-12 DIAGNOSIS — D729 Disorder of white blood cells, unspecified: Secondary | ICD-10-CM

## 2021-04-13 LAB — CBC WITH DIFFERENTIAL/PLATELET
Basophils Absolute: 0 10*3/uL (ref 0.0–0.1)
Basophils Relative: 1 % (ref 0.0–3.0)
Eosinophils Absolute: 0.2 10*3/uL (ref 0.0–0.7)
Eosinophils Relative: 4.4 % (ref 0.0–5.0)
HCT: 39.4 % (ref 36.0–46.0)
Hemoglobin: 13.2 g/dL (ref 12.0–15.0)
Lymphocytes Relative: 30.8 % (ref 12.0–46.0)
Lymphs Abs: 1.3 10*3/uL (ref 0.7–4.0)
MCHC: 33.4 g/dL (ref 30.0–36.0)
MCV: 87.4 fl (ref 78.0–100.0)
Monocytes Absolute: 0.4 10*3/uL (ref 0.1–1.0)
Monocytes Relative: 8.5 % (ref 3.0–12.0)
Neutro Abs: 2.3 10*3/uL (ref 1.4–7.7)
Neutrophils Relative %: 55.3 % (ref 43.0–77.0)
Platelets: 215 10*3/uL (ref 150.0–400.0)
RBC: 4.51 Mil/uL (ref 3.87–5.11)
RDW: 12.9 % (ref 11.5–15.5)
WBC: 4.1 10*3/uL (ref 4.0–10.5)

## 2021-06-10 ENCOUNTER — Other Ambulatory Visit: Payer: Self-pay

## 2021-06-10 MED ORDER — VUITY 1.25 % OP SOLN
OPHTHALMIC | 6 refills | Status: AC
  Filled 2021-06-10: qty 2.5, 30d supply, fill #0

## 2021-06-13 ENCOUNTER — Other Ambulatory Visit: Payer: Self-pay

## 2021-06-24 ENCOUNTER — Ambulatory Visit: Payer: No Typology Code available for payment source | Attending: Internal Medicine

## 2021-06-24 ENCOUNTER — Other Ambulatory Visit: Payer: Self-pay

## 2021-06-24 DIAGNOSIS — Z23 Encounter for immunization: Secondary | ICD-10-CM

## 2021-06-24 MED ORDER — PFIZER-BIONT COVID-19 VAC-TRIS 30 MCG/0.3ML IM SUSP
INTRAMUSCULAR | 0 refills | Status: AC
  Filled 2021-06-24: qty 0.3, 1d supply, fill #0

## 2021-06-24 NOTE — Progress Notes (Signed)
   Covid-19 Vaccination Clinic  Name:  [name] [name]    MRN: [id number] DOB: 03/28/1951  06/24/2021  [name] was observed post Covid-19 immunization for 15 minutes without incident. She was provided with Vaccine Information Sheet and instruction to access the V-Safe system.   [name] was instructed to call 911 with any severe reactions post vaccine: Difficulty breathing  Swelling of face and throat  A fast heartbeat  A bad rash all over body  Dizziness and weakness   Immunizations Administered     Name Date Dose VIS Date Route   PFIZER Comrnaty(Gray TOP) Covid-19 Vaccine 06/24/2021  2:09 PM 0.3 mL 10/21/2020 Intramuscular   Manufacturer: [place]   Lot: [id number]   NDC: [phone]       Covid-19 Vaccination Clinic  Name:  [name] [name]    MRN: [id number] DOB: [birth year]  06/24/2021  [name] was observed post Covid-19 immunization for 15 minutes without incident. She was provided with Vaccine Information Sheet and instruction to access the V-Safe system.   [name] was instructed to call 911 with any severe reactions post vaccine: Difficulty breathing  Swelling of face and throat  A fast heartbeat  A bad rash all over body  Dizziness and weakness   Immunizations Administered     Name Date Dose VIS Date Route   PFIZER Comrnaty(Gray TOP) Covid-19 Vaccine 06/24/2021  2:09 PM 0.3 mL 10/21/2020 Intramuscular   Manufacturer: [place]   Lot: [id number]   NDC: [phone]

## 2021-08-09 IMAGING — MG DIGITAL SCREENING BILAT W/ TOMO W/ CAD
6 of 10 series · 6 of 30 positions shown · non-contrast
Comparison: Previous exam(s).

CLINICAL DATA: Screening.

EXAM:
DIGITAL SCREENING BILATERAL MAMMOGRAM WITH TOMO AND CAD

[L XCCL synth-2D]
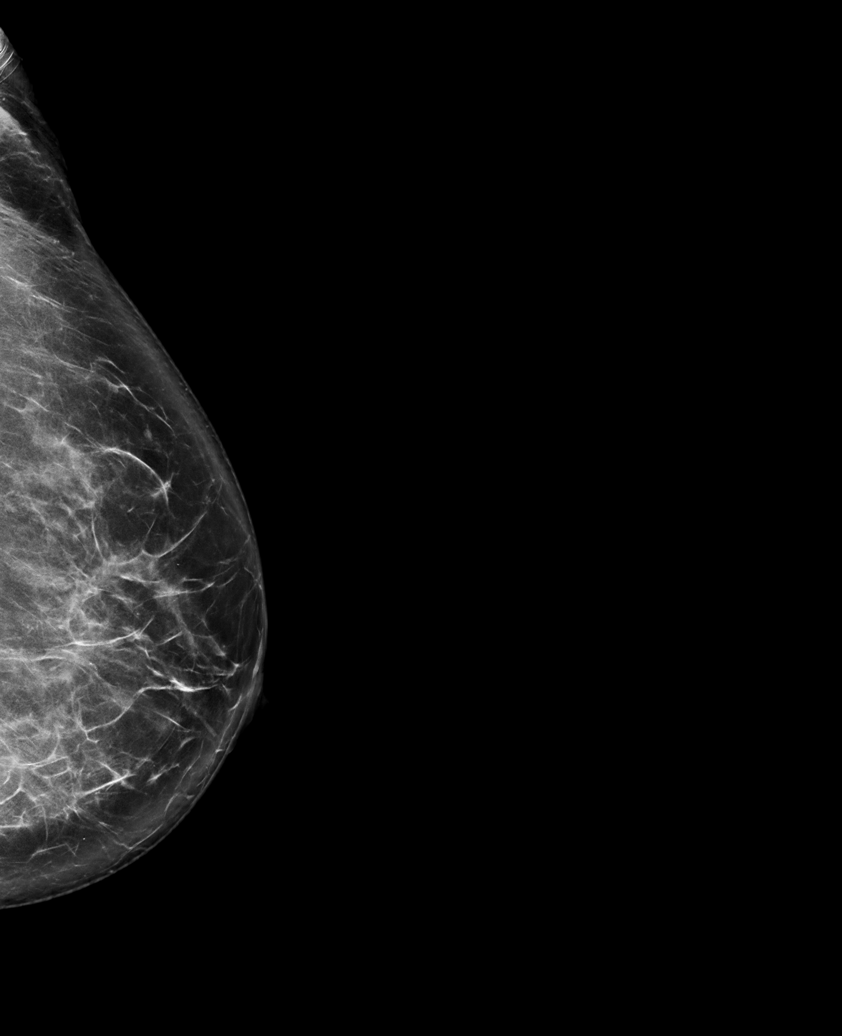

[L MLO synth-2D]
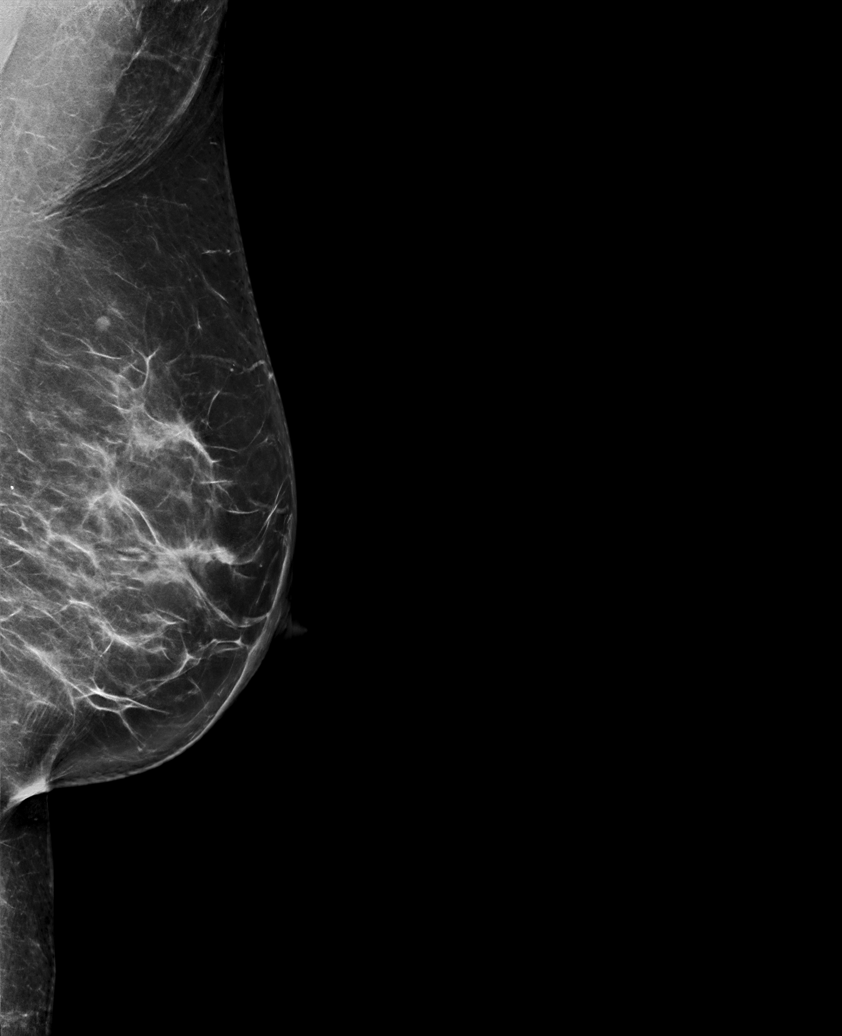

[R MLO synth-2D]
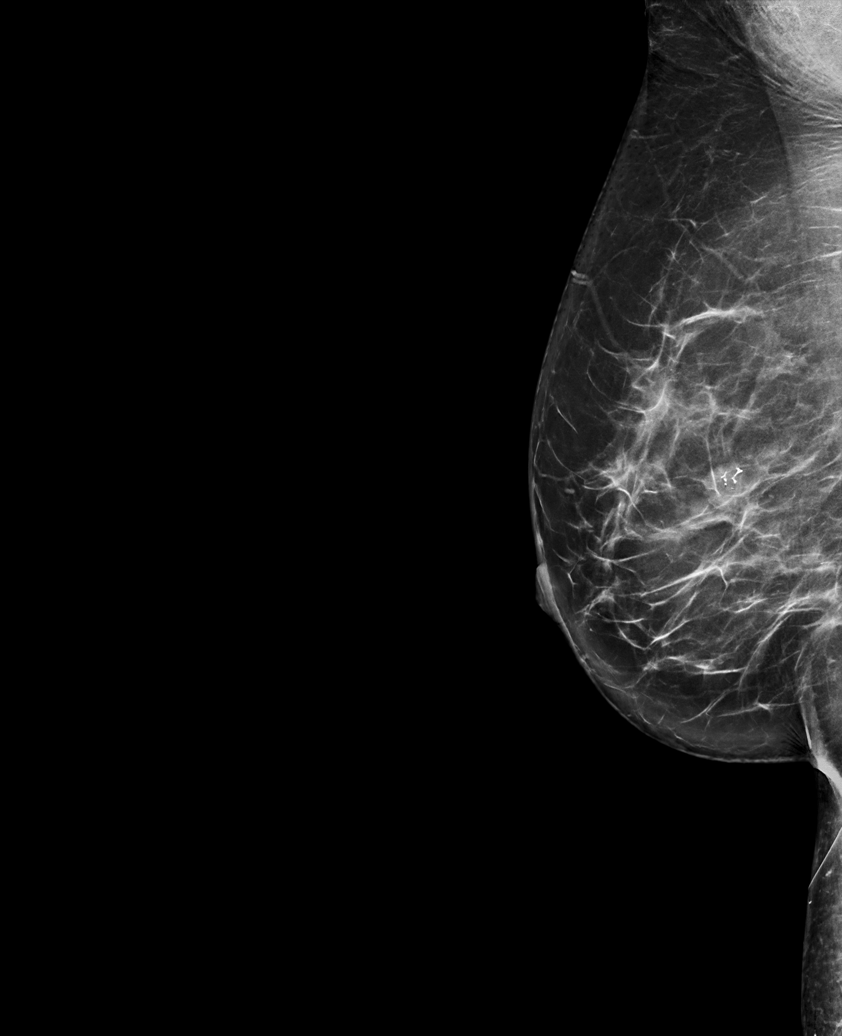

[R CC synth-2D]
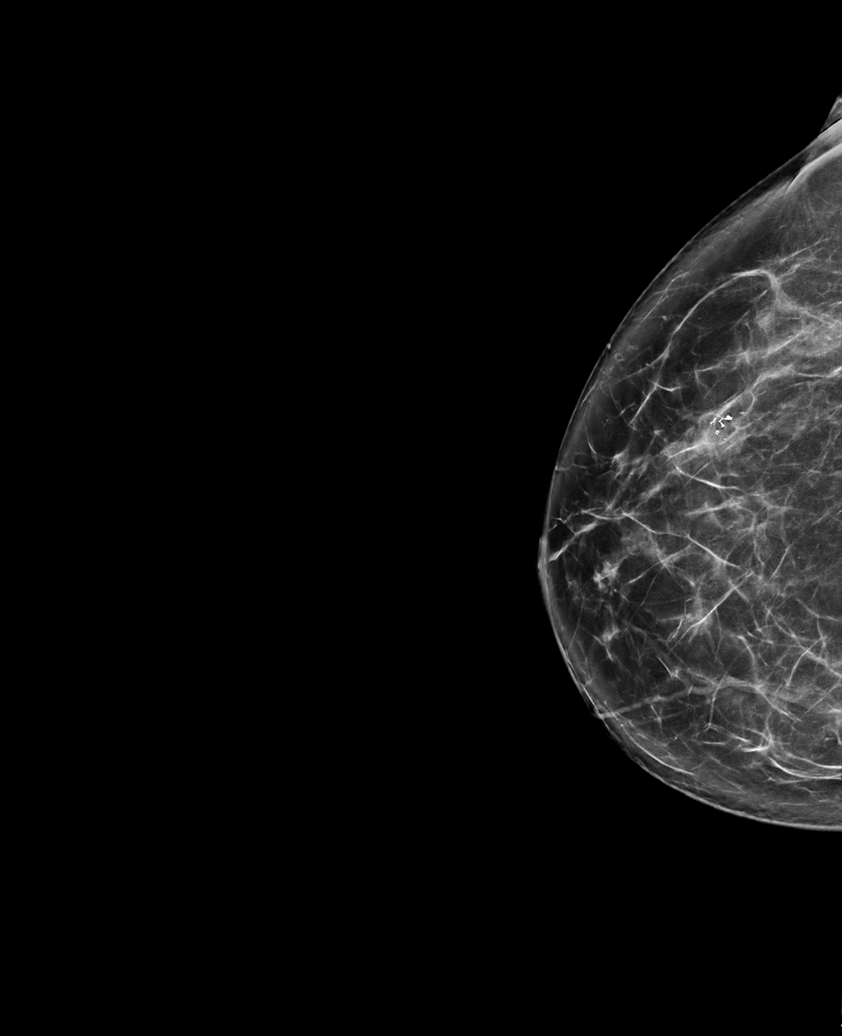

[L CC synth-2D]
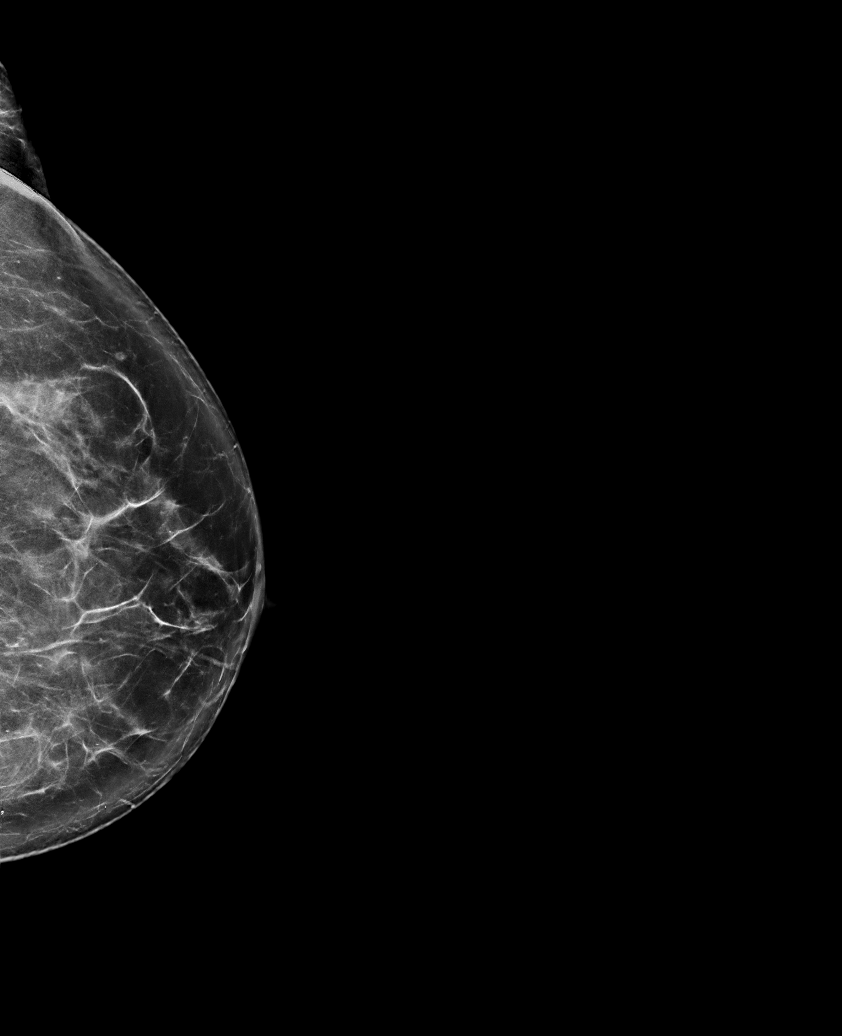

[R MLO tomo · tomo slice 43/86.0]
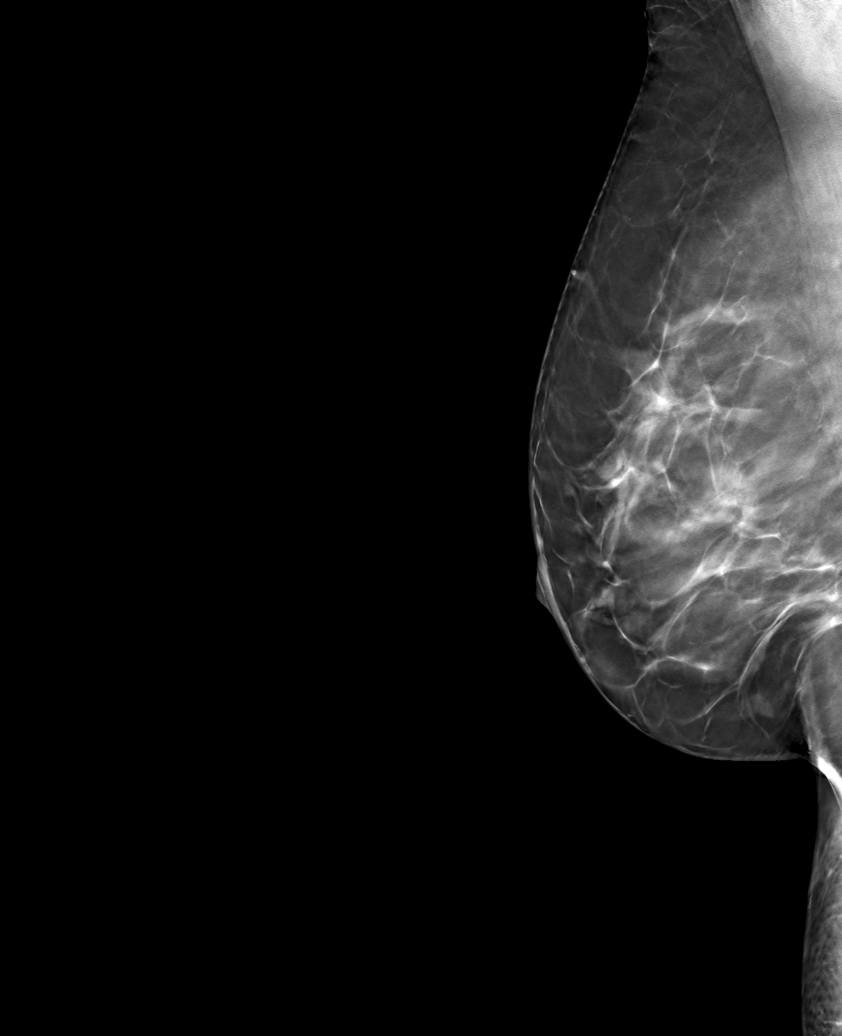

[6 of 30 positions shown; findings below may reference images not displayed]

ACR Breast Density Category b: There are scattered areas of
fibroglandular density.
FINDINGS: There are no findings suspicious for malignancy. Images were
processed with CAD.
IMPRESSION: No mammographic evidence of malignancy. A result letter of this
screening mammogram will be mailed directly to the patient.

RECOMMENDATION:
Screening mammogram in one year. (Code:[id number])

BI-RADS CATEGORY  1: Negative.

## 2021-09-30 ENCOUNTER — Ambulatory Visit: Payer: No Typology Code available for payment source | Attending: Internal Medicine

## 2021-09-30 DIAGNOSIS — Z23 Encounter for immunization: Secondary | ICD-10-CM

## 2021-09-30 NOTE — Progress Notes (Signed)
   Covid-19 Vaccination Clinic  Name:  [name] [name]    MRN: [id number] DOB: 1951-10-24  09/30/2021  [name] was observed post Covid-19 immunization for 15 minutes without incident. She was provided with Vaccine Information Sheet and instruction to access the V-Safe system.   [name] was instructed to call 911 with any severe reactions post vaccine: Difficulty breathing  Swelling of face and throat  A fast heartbeat  A bad rash all over body  Dizziness and weakness   Immunizations Administered     Name Date Dose VIS Date Route   Pfizer Covid-19 Vaccine Bivalent Booster 09/30/2021  2:44 PM 0.3 mL 07/13/2021 Intramuscular   Manufacturer: [place]   Lot: [id number]   [place]: [place], PharmD, MBA Clinical Acute Care Pharmacist

## 2021-10-04 ENCOUNTER — Other Ambulatory Visit: Payer: Self-pay

## 2021-10-04 MED ORDER — PFIZER COVID-19 VAC BIVALENT 30 MCG/0.3ML IM SUSP
INTRAMUSCULAR | 0 refills | Status: AC
Start: 2021-10-04 — End: ?
  Filled 2021-10-04: qty 0.3, 1d supply, fill #0

## 2021-11-19 ENCOUNTER — Emergency Department
Admission: EM | Admit: 2021-11-19 | Discharge: 2021-11-19 | Disposition: A | Discharge status: Home / Self Care | Payer: No Typology Code available for payment source | Attending: Student in an Organized Health Care Education/Training Program | Admitting: Student in an Organized Health Care Education/Training Program

## 2021-11-19 ENCOUNTER — Emergency Department: Payer: No Typology Code available for payment source

## 2021-11-19 ENCOUNTER — Other Ambulatory Visit: Payer: Self-pay

## 2021-11-19 ENCOUNTER — Encounter: Payer: Self-pay | Admitting: Pharmacy Technician

## 2021-11-19 DIAGNOSIS — S0101XA Laceration without foreign body of scalp, initial encounter: Secondary | ICD-10-CM | POA: Diagnosis not present

## 2021-11-19 DIAGNOSIS — S0990XA Unspecified injury of head, initial encounter: Secondary | ICD-10-CM | POA: Diagnosis present

## 2021-11-19 DIAGNOSIS — R52 Pain, unspecified: Secondary | ICD-10-CM

## 2021-11-19 DIAGNOSIS — W11XXXA Fall on and from ladder, initial encounter: Secondary | ICD-10-CM | POA: Insufficient documentation

## 2021-11-19 NOTE — Discharge Instructions (Addendum)
-  Please return the emergency department anytime if you begin experiencing new or worsening symptoms. -Return to the emergency department, urgent care, or your primary care provider in 10 days for staple removal. -Do not wash your hair for least 24 hours.  You may resume washing after that.  Avoid oils or emollients.

## 2021-11-19 NOTE — ED Provider Triage Note (Addendum)
Emergency Medicine Provider Triage Evaluation Note  [name] [name] , a 71 y.o. female  was evaluated in triage.  Pt complains of fall from ladder with right heel pain, patient did hit her head and sustained a laceration to the right parietal scalp.  Patient was putting up Christmas decorations when she fell off a 12 foot ladder.  She landed on her feet which then caused her to fall backwards striking her head.  No loss of consciousness.  No subsequent loss of consciousness.  Tetanus shot was 5 years ago.  Review of Systems  Positive: Fall, head injury, foot injury Negative: Loss of consciousness, radicular symptoms in the upper or lower extremity, low back pain  Physical Exam  There were no vitals taken for this visit. Gen:   Awake, no distress   Resp:  Normal effort  MSK:   Moves extremities without difficulty  Other:  Laceration.  Head has matted hair over laceration, unable to visualize depth and length of laceration at this time.  Medical Decision Making  Medically screening exam initiated at 6:40 PM.  Appropriate orders placed.  [name] [name] was informed that the remainder of the evaluation will be completed by another provider, this initial triage assessment does not replace that evaluation, and the importance of remaining in the ED until their evaluation is complete.  Patient sustained a fall off of a ladder roughly 12 feet, landed on her feet and hit her head.  Patient will CT scan head and neck, x-ray of the right foot.  Up-to-date on tetanus immunization.   [name] Moll, PA-C 11/19/21 1840    [name] Moll, PA-C 11/19/21 1904

## 2021-11-19 NOTE — ED Triage Notes (Signed)
Pt here with reports of falling backward off of ladder and hitting her R heel and then falling and hitting head on the lawnmower. Pt with matted blood in hair. Denies LOC, denies anticoagulation. Pt ambulatory to triage room.

## 2021-11-19 NOTE — ED Provider Notes (Signed)
[facility] Provider Note    Event Date/Time   First MD Initiated Contact with Patient 11/19/21 2030     (approximate)   History   Chief Complaint Fall and Laceration   HPI  [name] [name] is a 71 y.o. female, unremarkable medical history, presents emergency department for evaluation of head injury.  Patient states that she fell backwards off of a ladder while trying to put items on a shelf.  When she fell, she hit her head on a lawnmower.  Denies LOC, nausea/vomiting, blood thinner use, or symptoms preceding the fall.  She is currently endorsing headache, right heel pain, and neck stiffness.  Denies chest pain, shortness of breath, abdominal pain, back pain, or numbness/tingliness in her upper or lower extremities.  History Limitations: No limitations      Physical Exam  Triage Vital Signs: ED Triage Vitals  Enc Vitals Group     BP 11/19/21 1845 (!) 166/78     Pulse Rate 11/19/21 1845 76     Resp 11/19/21 1845 16     Temp 11/19/21 1845 99.1 F (37.3 C)     Temp src --      SpO2 11/19/21 1845 99 %     Weight --      Height --      Head Circumference --      Peak Flow --      Pain Score 11/19/21 1844 6     Pain Loc --      Pain Edu? --      Excl. in [place]? --     Most recent vital signs: Vitals:   11/19/21 1845  BP: (!) 166/78  Pulse: 76  Resp: 16  Temp: 99.1 F (37.3 C)  SpO2: 99%     Physical Exam Constitutional:      General: She is not in acute distress.    Appearance: Normal appearance. She is not ill-appearing.  HENT:     Head:     Comments: V-shape laceration approximately 2.5 cm in length, 2 mm deep, noted on the top of the patient's scalp.  Another 2 cm linear laceration appreciated on the right posterior aspect of the head. Pulmonary:     Effort: Pulmonary effort is normal.  Abdominal:     General: Abdomen is flat.     Palpations: Abdomen is soft.     Tenderness: There is no abdominal tenderness.  Musculoskeletal:      Cervical back: Normal range of motion and neck supple. No rigidity or tenderness.  Skin:    General: Skin is warm and dry.     Capillary Refill: Capillary refill takes less than 2 seconds.  Neurological:     Mental Status: She is alert. Mental status is at baseline.      ED Results / Procedures / Treatments  Labs (all labs ordered are listed, but only abnormal results are displayed) Labs Reviewed - No data to display   EKG Not applicable.   RADIOLOGY I personally viewed and evaluated these images as part of my medical decision making, as well as reviewing the written report by the radiologist.  ED Provider Interpretation: I agree with the interpretation of the radiologist.  No acute intracranial abnormalities.  Neck CT negative for acute pathology.  DG right foot negative.  CT HEAD WO CONTRAST (5MM)  Result Date: 11/19/2021 CLINICAL DATA:  Polytrauma, blunt.  Fall from ladder. EXAM: CT HEAD WITHOUT CONTRAST TECHNIQUE: Contiguous axial images were obtained from  the base of the skull through the vertex without intravenous contrast. COMPARISON:  None. FINDINGS: Brain: No acute intracranial abnormality. Specifically, no hemorrhage, hydrocephalus, mass lesion, acute infarction, or significant intracranial injury. Vascular: No hyperdense vessel or unexpected calcification. Skull: No acute calvarial abnormality. Sinuses/Orbits: No acute findings Other: None IMPRESSION: No acute intracranial abnormality. Electronically Signed   By: Rolm Baptise M.D.   On: 11/19/2021 19:16   CT Cervical Spine Wo Contrast  Result Date: 11/19/2021 CLINICAL DATA:  Polytrauma, blunt.  Fall from ladder. EXAM: CT CERVICAL SPINE WITHOUT CONTRAST TECHNIQUE: Multidetector CT imaging of the cervical spine was performed without intravenous contrast. Multiplanar CT image reconstructions were also generated. COMPARISON:  None. FINDINGS: Alignment: 3 mm degenerative anterolisthesis of C5 on C6. 2 mm degenerative  anterolisthesis of C6 on C7. Skull base and vertebrae: No acute fracture. No primary bone lesion or focal pathologic process. Soft tissues and spinal canal: No prevertebral fluid or swelling. No visible canal hematoma. Disc levels: Advanced diffuse degenerative facet disease bilaterally, right greater than left. Moderate diffuse degenerative disc disease. Upper chest: Biapical scarring.  No acute findings Other: None IMPRESSION: Advanced diffuse degenerative facet disease and moderate degenerative disc disease. No acute bony abnormality. Electronically Signed   By: Rolm Baptise M.D.   On: 11/19/2021 19:19   DG Foot Complete Right  Result Date: 11/19/2021 CLINICAL DATA:  Right heel pain.  Fall from ladder EXAM: RIGHT FOOT COMPLETE - 3+ VIEW COMPARISON:  None. FINDINGS: There is no evidence of fracture or dislocation. There is no evidence of arthropathy or other focal bone abnormality. Soft tissues are unremarkable. IMPRESSION: Negative. Electronically Signed   By: Rolm Baptise M.D.   On: 11/19/2021 19:20    PROCEDURES:  Critical Care performed: None.  Marland Kitchen.Laceration Repair  Date/Time: 11/20/2021 10:56 AM Performed by: Teodoro Spray, PA Authorized by: Teodoro Spray, PA   Consent:    Consent obtained:  Verbal   Consent given by:  Patient   Risks discussed:  Infection and pain   Alternatives discussed:  No treatment Universal protocol:    Patient identity confirmed:  Verbally with patient Anesthesia:    Anesthesia method:  None Laceration details:    Location:  Scalp   Scalp location:  Crown   Length (cm):  2.5   Depth (mm):  2 Pre-procedure details:    Preparation:  Patient was prepped and draped in usual sterile fashion Exploration:    Hemostasis achieved with:  Direct pressure   Wound extent: no fascia violation noted, no foreign bodies/material noted and no underlying fracture noted     Contaminated: no   Treatment:    Area cleansed with:  Saline   Amount of cleaning:   Standard   Irrigation solution:  Sterile saline   Irrigation volume:  1000   Irrigation method:  Pressure wash Skin repair:    Repair method:  Staples   Number of staples:  5 Approximation:    Approximation:  Close Repair type:    Repair type:  Simple Post-procedure details:    Dressing:  Open (no dressing)   Procedure completion:  Tolerated well, no immediate complications Comments:     Light amount of dermabond placed on between the 3rd-4th staple to promote better closure.  Marland Kitchen.Laceration Repair  Date/Time: 11/20/2021 11:04 AM Performed by: Teodoro Spray, PA Authorized by: Teodoro Spray, PA   Consent:    Consent obtained:  Verbal   Consent given by:  Patient   Risks, benefits, and  alternatives were discussed: yes     Risks discussed:  Infection, pain and poor cosmetic result Universal protocol:    Patient identity confirmed:  Verbally with patient Anesthesia:    Anesthesia method:  None Laceration details:    Location:  Scalp   Scalp location:  Occipital   Length (cm):  2   Depth (mm):  2 Exploration:    Hemostasis achieved with:  Direct pressure   Wound extent: no areolar tissue violation noted and no fascia violation noted   Treatment:    Area cleansed with:  Saline   Irrigation solution:  Sterile saline   Irrigation volume:  1000   Irrigation method:  Pressure wash Skin repair:    Repair method:  Staples   Number of staples:  2 Approximation:    Approximation:  Close Repair type:    Repair type:  Simple Post-procedure details:    Dressing:  Open (no dressing)   Procedure completion:  Tolerated well, no immediate complications    MEDICATIONS ORDERED IN ED: Medications - No data to display   IMPRESSION / MDM / [place] / ED COURSE  I reviewed the triage vital signs and the nursing notes.                              [name] [name] is a 71 y.o. female, unremarkable medical history, presents emergency department for evaluation  of head injury.  Patient states that she fell backwards off of a ladder while trying to put items on a shelf.  When she fell, she hit her head on a lawnmower.  Denies LOC, nausea/vomiting, blood thinner use, or symptoms preceding the fall.  She is currently endorsing headache, right heel pain, and neck stiffness.  Denies chest pain, shortness of breath, abdominal pain, back pain, or numbness/tingliness in her upper or lower extremities.  Differential diagnosis includes, but is not limited to, concussion, scalp laceration, subdural/epidural hematoma, diffuse axonal injury  Patient appears well.  She is sitting upright comfortably in bed.  NAD.  2 notable lacerations on the crown and occipital aspect of her scalp.  No active bleeding.  Vital signs are within normal limits.  Head CT negative for acute intracranial abnormalities.  Cervical spine CT negative for acute pathology.  DG right ankle negative.  Given the patient's history, physical exam, work-up thus far, I do not suspect any serious or life-threatening pathology.  Low suspicion for occult fractures.  The patient's lacerations were cleansed and repaired.  See above for details.  Patient was discharged with anticipatory guidance, return precautions, and educational material. Encouraged the patient to return to the emergency department at any time if they begin to experience any new or worsening symptoms.       FINAL CLINICAL IMPRESSION(S) / ED DIAGNOSES   Final diagnoses:  Scalp laceration, initial encounter     Rx / DC Orders   ED Discharge Orders     None        Note:  This document was prepared using Dragon voice recognition software and may include unintentional dictation errors.   Teodoro Spray, [place] 11/20/21 1109    Merlyn Lot, MD 11/20/21 1739

## 2022-03-27 ENCOUNTER — Ambulatory Visit (INDEPENDENT_AMBULATORY_CARE_PROVIDER_SITE_OTHER): Payer: No Typology Code available for payment source | Admitting: Family Medicine

## 2022-03-27 ENCOUNTER — Encounter: Payer: Self-pay | Admitting: Family Medicine

## 2022-03-27 VITALS — BP 118/80 | HR 71 | Temp 98.3°F | Ht 63.0 in | Wt 138.4 lb

## 2022-03-27 DIAGNOSIS — Z1231 Encounter for screening mammogram for malignant neoplasm of breast: Secondary | ICD-10-CM

## 2022-03-27 DIAGNOSIS — R7303 Prediabetes: Secondary | ICD-10-CM

## 2022-03-27 DIAGNOSIS — Z780 Asymptomatic menopausal state: Secondary | ICD-10-CM

## 2022-03-27 DIAGNOSIS — Z13220 Encounter for screening for lipoid disorders: Secondary | ICD-10-CM

## 2022-03-27 DIAGNOSIS — E611 Iron deficiency: Secondary | ICD-10-CM

## 2022-03-27 DIAGNOSIS — Z0000 Encounter for general adult medical examination without abnormal findings: Secondary | ICD-10-CM | POA: Diagnosis not present

## 2022-03-27 NOTE — Assessment & Plan Note (Signed)
Physical exam completed.  Encouraged continued healthy diet and exercise.  Discussed adding in more weight training.  She will call to schedule her mammogram and bone density scan.  I did discuss that she could get a second updated COVID booster.  Lab work as outlined.  She notes she is not fasting. ?

## 2022-03-27 NOTE — Patient Instructions (Signed)
Nice to see you. ?We will get lab work today and contact you with the results. ?Please call [phone] to schedule your bone density scan and mammogram. ?

## 2022-03-27 NOTE — Progress Notes (Signed)
?[name], MD ?Phone: [phone] ? ?[name] [name] is a 71 y.o. female who presents today for CPE. ? ?Diet: Lots of salads and fruits.  Mostly lean meats.  No soda or sweet tea.  Occasional sweets. ?Exercise: She is on the treadmill 3 to 4 days a week.  She lifts weights once every couple of weeks. ?Pap smear: Aged out ?Colonoscopy: 05/10/2020 with 7-year recall, single tubular adenoma ?Mammogram: Due ?Family history- ? Colon cancer: no ? Breast cancer: no ? Ovarian cancer: no ?Menses: postmenopausal ?Vaccines-  ? Flu: out of season ? Tetanus: UTD ? Shingles: UTD ? COVID19: x4 ? Pneumonia: UTD ?HIV screening: declined in the past ?Hep C Screening: declined in the past ?Tobacco use: no ?Alcohol use: 1 glass of wine per week ?Illicit Drug use: no ?Dentist: yes ?Ophthalmology: yes ?Patient had a fall off of a ladder earlier this year.  She was evaluated in the emergency department and had her lacerations stapled.  She has had the staples removed.  She notes no other falls.  Imaging was negative for any acute changes. ? ? ?Active Ambulatory Problems  ?  Diagnosis Date Noted  ? Alopecia areata 07/24/2011  ? Routine general medical examination at a health care facility 08/12/2013  ? Arthralgia 08/27/2015  ? Left hip pain 12/28/2015  ? Arthritis of left hip 12/28/2015  ? Preoperative clearance 05/08/2016  ? S/P right THA, AA 06/20/2016  ? CMC arthritis, thumb, degenerative 03/24/2021  ? Iron deficiency 03/24/2021  ? ?Resolved Ambulatory Problems  ?  Diagnosis Date Noted  ? General medical exam 07/29/2012  ? Menopausal disorder 07/29/2012  ? Piriformis syndrome of left side 12/28/2015  ? S/P left THA, AA 05/23/2016  ? ?Past Medical History:  ?Diagnosis Date  ? Alopecia   ? Arthritis   ? C3 cervical fracture (HCC)   ? ? ?Family History  ?Problem Relation Age of Onset  ? Cancer Mother   ?     lung ca, died in 55s  ? Heart disease Father   ? Breast cancer Neg Hx   ? ? ?Social History  ? ?Socioeconomic History  ?  Marital status: Married  ?  Spouse name: Not on file  ? Number of children: Not on file  ? Years of education: Not on file  ? Highest education level: Not on file  ?Occupational History  ? Not on file  ?Tobacco Use  ? Smoking status: Never  ? Smokeless tobacco: Never  ?Substance and Sexual Activity  ? Alcohol use: Yes  ?  Alcohol/week: 0.0 standard drinks  ?  Comment: glass of wine every month or two  ? Drug use: No  ? Sexual activity: Yes  ?  Partners: Male  ?Other Topics Concern  ? Not on file  ?Social History Narrative  ? Not on file  ? ?Social Determinants of Health  ? ?Financial Resource Strain: Not on file  ?Food Insecurity: Not on file  ?Transportation Needs: Not on file  ?Physical Activity: Not on file  ?Stress: Not on file  ?Social Connections: Not on file  ?Intimate Partner Violence: Not on file  ? ? ?ROS ? ?General:  Negative for nexplained weight loss, fever ?Skin: Negative for new or changing mole, sore that won't heal ?HEENT: Negative for trouble hearing, trouble seeing, ringing in ears, mouth sores, hoarseness, change in voice, dysphagia. ?CV:  Negative for chest pain, dyspnea, edema, palpitations ?Resp: Negative for cough, dyspnea, hemoptysis ?GI: Negative for nausea, vomiting, diarrhea, constipation, abdominal pain, melena,  hematochezia. ?GU: Negative for dysuria, incontinence, urinary hesitance, hematuria, vaginal or penile discharge, polyuria, sexual difficulty, lumps in testicle or breasts ?MSK: Negative for muscle cramps or aches, joint pain or swelling ?Neuro: Negative for headaches, weakness, numbness, dizziness, passing out/fainting ?Psych: Negative for depression, anxiety, memory problems ? ?Objective ? ?Physical Exam ?Vitals:  ? 03/27/22 1444  ?BP: 118/80  ?Pulse: 71  ?Temp: 98.3 ?F (36.8 ?C)  ?SpO2: 98%  ? ? ?BP Readings from Last 3 Encounters:  ?03/27/22 118/80  ?11/19/21 (!) 166/78  ?03/24/21 120/80  ? ?Wt Readings from Last 3 Encounters:  ?03/27/22 138 lb 6.4 oz (62.8 kg)  ?03/24/21 148  lb 3.2 oz (67.2 kg)  ?05/10/20 145 lb (65.8 kg)  ? ? ?Physical Exam ?Constitutional:   ?   General: She is not in acute distress. ?   Appearance: She is not diaphoretic.  ?Cardiovascular:  ?   Rate and Rhythm: Normal rate and regular rhythm.  ?   Heart sounds: Normal heart sounds.  ?Pulmonary:  ?   Effort: Pulmonary effort is normal.  ?   Breath sounds: Normal breath sounds.  ?Abdominal:  ?   General: Bowel sounds are normal. There is no distension.  ?   Palpations: Abdomen is soft.  ?   Tenderness: There is no abdominal tenderness.  ?Musculoskeletal:  ?   Right lower leg: No edema.  ?   Left lower leg: No edema.  ?Lymphadenopathy:  ?   Cervical: No cervical adenopathy.  ?Skin: ?   General: Skin is warm and dry.  ?Neurological:  ?   Mental Status: She is alert.  ?Psychiatric:     ?   Mood and Affect: Mood normal.  ? ? ? ?Assessment/Plan:  ? ?Problem List Items Addressed This Visit   ? ? Iron deficiency  ? Relevant Orders  ? CBC  ? IBC + Ferritin  ? Routine general medical examination at a health care facility - Primary  ?  Physical exam completed.  Encouraged continued healthy diet and exercise.  Discussed adding in more weight training.  She will call to schedule her mammogram and bone density scan.  I did discuss that she could get a second updated COVID booster.  Lab work as outlined.  She notes she is not fasting. ? ?  ?  ? ?Other Visit Diagnoses   ? ? Lipid screening      ? Relevant Orders  ? Comp Met (CMET)  ? Lipid panel  ? Prediabetes      ? Relevant Orders  ? HgB A1c  ? Postmenopausal estrogen deficiency      ? Relevant Orders  ? DG Bone Density  ? Encounter for screening mammogram for malignant neoplasm of breast      ? Relevant Orders  ? MM 3D SCREEN BREAST BILATERAL  ? ?  ? ? ?Return in about 1 year (around 03/28/2023) for CPE. ? ? ?[name], MD ?[place] ? ?

## 2022-03-28 ENCOUNTER — Other Ambulatory Visit: Payer: Self-pay | Admitting: Family Medicine

## 2022-03-28 ENCOUNTER — Other Ambulatory Visit: Payer: Self-pay

## 2022-03-28 DIAGNOSIS — M180 Bilateral primary osteoarthritis of first carpometacarpal joints: Secondary | ICD-10-CM

## 2022-03-28 LAB — COMPREHENSIVE METABOLIC PANEL
ALT: 25 U/L (ref 0–35)
AST: 30 U/L (ref 0–37)
Albumin: 4.4 g/dL (ref 3.5–5.2)
Alkaline Phosphatase: 66 U/L (ref 39–117)
BUN: 21 mg/dL (ref 6–23)
CO2: 28 mEq/L (ref 19–32)
Calcium: 9.8 mg/dL (ref 8.4–10.5)
Chloride: 103 mEq/L (ref 96–112)
Creatinine, Ser: 0.9 mg/dL (ref 0.40–1.20)
GFR: 65.67 mL/min (ref >60.00)
Glucose, Bld: 109 mg/dL — ABNORMAL HIGH (ref 70–99)
Potassium: 4 mEq/L (ref 3.5–5.1)
Sodium: 140 mEq/L (ref 135–145)
Total Bilirubin: 0.5 mg/dL (ref 0.2–1.2)
Total Protein: 6.7 g/dL (ref 6.0–8.3)

## 2022-03-28 LAB — CBC
HCT: 40.7 % (ref 36.0–46.0)
Hemoglobin: 13.6 g/dL (ref 12.0–15.0)
MCHC: 33.3 g/dL (ref 30.0–36.0)
MCV: 87.7 fl (ref 78.0–100.0)
Platelets: 218 10*3/uL (ref 150.0–400.0)
RBC: 4.64 Mil/uL (ref 3.87–5.11)
RDW: 14 % (ref 11.5–15.5)
WBC: 4.3 10*3/uL (ref 4.0–10.5)

## 2022-03-28 LAB — LIPID PANEL
Cholesterol: 195 mg/dL (ref 0–200)
HDL: 67.7 mg/dL (ref >39.00)
LDL Cholesterol: 104 mg/dL — ABNORMAL HIGH (ref 0–99)
NonHDL: 127.73
Total CHOL/HDL Ratio: 3
Triglycerides: 117 mg/dL (ref 0.0–149.0)
VLDL: 23.4 mg/dL (ref 0.0–40.0)

## 2022-03-28 LAB — IBC + FERRITIN
Ferritin: 47.3 ng/mL (ref 10.0–291.0)
Iron: 91 ug/dL (ref 42–145)
Saturation Ratios: 19.9 % — ABNORMAL LOW (ref 20.0–50.0)
TIBC: 456.4 ug/dL — ABNORMAL HIGH (ref 250.0–450.0)
Transferrin: 326 mg/dL (ref 212.0–360.0)

## 2022-03-28 LAB — HEMOGLOBIN A1C: Hgb A1c MFr Bld: 5.8 % (ref 4.6–6.5)

## 2022-04-21 ENCOUNTER — Other Ambulatory Visit: Payer: Self-pay | Admitting: Family Medicine

## 2022-04-21 ENCOUNTER — Other Ambulatory Visit: Payer: Self-pay

## 2022-04-21 DIAGNOSIS — M180 Bilateral primary osteoarthritis of first carpometacarpal joints: Secondary | ICD-10-CM

## 2022-04-21 MED ORDER — MELOXICAM 7.5 MG PO TABS
7.5000 mg | ORAL_TABLET | Freq: Every day | ORAL | 0 refills | Status: DC
  Filled 2022-04-21: qty 30, 30d supply, fill #0

## 2022-04-21 NOTE — Telephone Encounter (Signed)
Refill request for pending Rx last refill 03/24/21 last OV May 2023. Please advise

## 2022-08-03 ENCOUNTER — Ambulatory Visit
Admission: RE | Admit: 2022-08-03 | Discharge: 2022-08-03 | Disposition: A | Discharge status: Home / Self Care | Payer: No Typology Code available for payment source | Source: Ambulatory Visit | Attending: Family Medicine | Admitting: Family Medicine

## 2022-08-03 ENCOUNTER — Other Ambulatory Visit: Payer: Self-pay | Admitting: Family Medicine

## 2022-08-03 DIAGNOSIS — Z780 Asymptomatic menopausal state: Secondary | ICD-10-CM

## 2022-08-03 DIAGNOSIS — Z1231 Encounter for screening mammogram for malignant neoplasm of breast: Secondary | ICD-10-CM | POA: Diagnosis present

## 2022-08-09 ENCOUNTER — Other Ambulatory Visit: Payer: Self-pay | Admitting: Family Medicine

## 2022-08-09 DIAGNOSIS — M180 Bilateral primary osteoarthritis of first carpometacarpal joints: Secondary | ICD-10-CM

## 2022-08-10 ENCOUNTER — Other Ambulatory Visit: Payer: Self-pay

## 2022-08-10 ENCOUNTER — Other Ambulatory Visit: Payer: Self-pay | Admitting: Family Medicine

## 2022-08-10 DIAGNOSIS — M180 Bilateral primary osteoarthritis of first carpometacarpal joints: Secondary | ICD-10-CM

## 2022-08-10 MED FILL — Meloxicam Tab 7.5 MG: ORAL | 30 days supply | Qty: 30 | Fill #0 | Status: CN

## 2022-08-29 ENCOUNTER — Other Ambulatory Visit: Payer: Self-pay

## 2022-12-14 ENCOUNTER — Encounter: Payer: Self-pay | Admitting: Family Medicine

## 2023-04-02 ENCOUNTER — Encounter: Payer: No Typology Code available for payment source | Admitting: Family Medicine
